# Patient Record
Sex: Female | Born: 1975 | Race: Black or African American | Hispanic: No | Marital: Single | Smoking: Former smoker
Health system: Southern US, Community
[De-identification: ages and names within clinical notes are randomized; demographics above are authoritative.]

## PROBLEM LIST (undated history)

## (undated) DIAGNOSIS — D649 Anemia, unspecified: Secondary | ICD-10-CM

## (undated) DIAGNOSIS — M1990 Unspecified osteoarthritis, unspecified site: Secondary | ICD-10-CM

---

## 2013-05-10 ENCOUNTER — Emergency Department (HOSPITAL_COMMUNITY)
Admission: EM | Admit: 2013-05-10 | Discharge: 2013-05-10 | Disposition: A | Discharge status: Home / Self Care | Payer: Medicaid Other | Attending: Emergency Medicine | Admitting: Emergency Medicine

## 2013-05-10 ENCOUNTER — Encounter (HOSPITAL_COMMUNITY): Payer: Self-pay | Admitting: Emergency Medicine

## 2013-05-10 DIAGNOSIS — Z3202 Encounter for pregnancy test, result negative: Secondary | ICD-10-CM | POA: Insufficient documentation

## 2013-05-10 DIAGNOSIS — R109 Unspecified abdominal pain: Secondary | ICD-10-CM | POA: Insufficient documentation

## 2013-05-10 DIAGNOSIS — Z862 Personal history of diseases of the blood and blood-forming organs and certain disorders involving the immune mechanism: Secondary | ICD-10-CM | POA: Insufficient documentation

## 2013-05-10 DIAGNOSIS — R112 Nausea with vomiting, unspecified: Secondary | ICD-10-CM

## 2013-05-10 DIAGNOSIS — F17200 Nicotine dependence, unspecified, uncomplicated: Secondary | ICD-10-CM | POA: Insufficient documentation

## 2013-05-10 HISTORY — DX: Anemia, unspecified: D64.9

## 2013-05-10 LAB — POCT I-STAT, CHEM 8
BUN: 4 mg/dL — ABNORMAL LOW (ref 6–23)
CREATININE: 0.8 mg/dL (ref 0.50–1.10)
Calcium, Ion: 1.16 mmol/L (ref 1.12–1.23)
Chloride: 102 mEq/L (ref 96–112)
Glucose, Bld: 96 mg/dL (ref 70–99)
HCT: 39 % (ref 36.0–46.0)
HEMOGLOBIN: 13.3 g/dL (ref 12.0–15.0)
Potassium: 3.5 mEq/L — ABNORMAL LOW (ref 3.7–5.3)
SODIUM: 142 meq/L (ref 137–147)
TCO2: 26 mmol/L (ref 0–100)

## 2013-05-10 LAB — URINALYSIS, ROUTINE W REFLEX MICROSCOPIC
Bilirubin Urine: NEGATIVE
Glucose, UA: NEGATIVE mg/dL
Ketones, ur: NEGATIVE mg/dL
LEUKOCYTES UA: NEGATIVE
Nitrite: NEGATIVE
Protein, ur: NEGATIVE mg/dL
SPECIFIC GRAVITY, URINE: 1.021 (ref 1.005–1.030)
UROBILINOGEN UA: 0.2 mg/dL (ref 0.0–1.0)
pH: 8 (ref 5.0–8.0)

## 2013-05-10 LAB — URINE MICROSCOPIC-ADD ON

## 2013-05-10 LAB — POCT PREGNANCY, URINE: Preg Test, Ur: NEGATIVE

## 2013-05-10 MED ORDER — ONDANSETRON 4 MG PO TBDP: 4.0000 mg | ORAL_TABLET | Freq: Three times a day (TID) | ORAL | Status: DC | PRN

## 2013-05-10 MED ORDER — ONDANSETRON 4 MG PO TBDP
8.0000 mg | ORAL_TABLET | Freq: Once | ORAL | Status: AC
  Administered 2013-05-10: 8 mg via ORAL
  Filled 2013-05-10: qty 2

## 2013-05-10 MED ORDER — ACETAMINOPHEN 325 MG PO TABS
650.0000 mg | ORAL_TABLET | Freq: Once | ORAL | Status: AC
  Administered 2013-05-10: 650 mg via ORAL
  Filled 2013-05-10: qty 2

## 2013-05-10 NOTE — Discharge Instructions (Signed)
Take the prescribed medication as directed for nausea. Drink plenty of fluids to keep yourself hydrated.  May wish to start with bland diet and progress as tolerated. Return to the ED for new or worsening symptoms.

## 2013-05-10 NOTE — ED Notes (Addendum)
C/o nv, onset 2330, last emesis ~20 minutes ago, also abd pain, (denies: diarrhea, fever or other sx), rates pain 10/10. Last ate 2000. Last BM today (normal). No meds PTA. Husband with pt. Alert, NAD, calm, interactive.

## 2013-05-10 NOTE — ED Provider Notes (Signed)
Medical screening examination/treatment/procedure(s) were performed by non-physician practitioner and as supervising physician I was immediately available for consultation/collaboration.  EKG Interpretation   None        Kayly Kriegel K Mourad Cwikla-Rasch, MD 05/10/13 2306 

## 2013-05-10 NOTE — ED Notes (Signed)
Pt requesting pain medication. Pt now c/o back of the head pain.

## 2013-05-10 NOTE — ED Provider Notes (Signed)
CSN: [id number]     Arrival date & time 05/10/13  0422 History   First MD Initiated Contact with Patient 05/10/13 [phone]     Chief Complaint  Patient presents with  . Emesis  . Abdominal Pain   (Consider location/radiation/quality/duration/timing/severity/associated sxs/prior Treatment) The history is provided by the patient and medical records.   This is a 38 y.o. F with PMH significant for anemia  Presenting to the ED for nausea and vomiting for the past 6 hours.  States she has some intermittent abdominal cramping which seems to move around her abdomen.  Pt denies any recent sick contacts with similar sx.  No recent changes in diet.  BM normal, no diarrhea.  No fevers or chills.  Denies urinary sx or vaginal complaints.  Prior c-section, no other abdominal surgeries.  No prior GI hx.  VS stable on arrival.  Past Medical History  Diagnosis Date  . Anemia    Past Surgical History  Procedure Laterality Date  . Cesarean section     No family history on file. History  Substance Use Topics  . Smoking status: Current Every Day Smoker  . Smokeless tobacco: Not on file  . Alcohol Use: No   OB History   Grav Para Term Preterm Abortions TAB SAB Ect Mult Living                 Review of Systems  Gastrointestinal: Positive for nausea and vomiting.  All other systems reviewed and are negative.    Allergies  Review of patient's allergies indicates no known allergies.  Home Medications  No current outpatient prescriptions on file. BP 141/58  Pulse 67  Temp(Src) 98.2 F (36.8 C) (Oral)  Resp 18  SpO2 97%  LMP 04/07/2013  Physical Exam  Nursing note and vitals reviewed. Constitutional: She is oriented to person, place, and time. She appears well-developed and well-nourished. No distress.  HENT:  Head: Normocephalic and atraumatic.  Mouth/Throat: Oropharynx is clear and moist.  Mucous membranes moist  Eyes: Conjunctivae and EOM are normal. Pupils are equal, round, and reactive  to light.  Neck: Normal range of motion. Neck supple.  Cardiovascular: Normal rate, regular rhythm and normal heart sounds.   Pulmonary/Chest: Effort normal and breath sounds normal. No respiratory distress. She has no wheezes.  Abdominal: Soft. Bowel sounds are normal. There is no tenderness. There is no guarding.  Abdomen soft, non-distended, no peritoneal signs  Musculoskeletal: Normal range of motion.  Neurological: She is alert and oriented to person, place, and time.  Skin: Skin is warm and dry. She is not diaphoretic.  Psychiatric: She has a normal mood and affect.    ED Course  Procedures (including critical care time) Labs Review Labs Reviewed  URINALYSIS, ROUTINE W REFLEX MICROSCOPIC - Abnormal; Notable for the following:    APPearance TURBID (*)    Hgb urine dipstick TRACE (*)    All other components within normal limits  URINE MICROSCOPIC-ADD ON - Abnormal; Notable for the following:    Squamous Epithelial / LPF FEW (*)    Bacteria, UA FEW (*)    All other components within normal limits  POCT I-STAT, CHEM 8 - Abnormal; Notable for the following:    Potassium 3.5 (*)    BUN 4 (*)    All other components within normal limits  POCT PREGNANCY, URINE   Imaging Review No results found.  EKG Interpretation   None       MDM   1. Nausea &  vomiting    On initial evaluation, pt sleeping in bed comfortably in NAD.  Once awoken, she states she feels better after zofran and fluids and cramping has resolved.  Abdominal exam is benign.  Will initiate PO challenge and likely discharge.  7:22 AM Pt has tolerated PO without recurrent sx or active vomiting.  Repeat abdominal exam remains benign-- low suspicion for acute or surgical abdomen at this time including, but not limited to, appendicitis, SBO, cholecystitis, pancreatitis, etc.. Sx likely due to viral gastroenteritis. Pt will be discharged with zofran.  Advised to drink plenty of fluids, start with bland diet and  progress as tolerated.  Discussed plan with pt and husband, she acknowledged understanding and agreed with plan of care.  Return precautions advised for new or worsening sx.  Garlon HatchetLisa M Sanders, PA-C 05/10/13 [phone]

## 2013-05-10 NOTE — ED Notes (Signed)
Pt requesting pain medication.  

## 2013-07-23 ENCOUNTER — Ambulatory Visit: Payer: Self-pay | Admitting: Advanced Practice Midwife

## 2013-08-20 ENCOUNTER — Emergency Department (HOSPITAL_COMMUNITY)
Admission: EM | Admit: 2013-08-20 | Discharge: 2013-08-20 | Disposition: A | Discharge status: Home / Self Care | Payer: Medicaid Other | Attending: Emergency Medicine | Admitting: Emergency Medicine

## 2013-08-20 ENCOUNTER — Encounter (HOSPITAL_COMMUNITY): Payer: Self-pay | Admitting: Emergency Medicine

## 2013-08-20 ENCOUNTER — Emergency Department (HOSPITAL_COMMUNITY): Payer: Medicaid Other

## 2013-08-20 DIAGNOSIS — F17200 Nicotine dependence, unspecified, uncomplicated: Secondary | ICD-10-CM | POA: Insufficient documentation

## 2013-08-20 DIAGNOSIS — N83209 Unspecified ovarian cyst, unspecified side: Secondary | ICD-10-CM | POA: Insufficient documentation

## 2013-08-20 DIAGNOSIS — Z862 Personal history of diseases of the blood and blood-forming organs and certain disorders involving the immune mechanism: Secondary | ICD-10-CM | POA: Insufficient documentation

## 2013-08-20 DIAGNOSIS — Z3202 Encounter for pregnancy test, result negative: Secondary | ICD-10-CM | POA: Insufficient documentation

## 2013-08-20 LAB — COMPREHENSIVE METABOLIC PANEL
ALBUMIN: 3.6 g/dL (ref 3.5–5.2)
ALT: 9 U/L (ref 0–35)
AST: 15 U/L (ref 0–37)
Alkaline Phosphatase: 65 U/L (ref 39–117)
BUN: 11 mg/dL (ref 6–23)
CALCIUM: 8.8 mg/dL (ref 8.4–10.5)
CO2: 26 mEq/L (ref 19–32)
CREATININE: 0.9 mg/dL (ref 0.50–1.10)
Chloride: 106 mEq/L (ref 96–112)
GFR calc Af Amer: >90 mL/min (ref >90)
GFR calc non Af Amer: 81 mL/min — ABNORMAL LOW (ref >90)
Glucose, Bld: 97 mg/dL (ref 70–99)
Potassium: 3.8 mEq/L (ref 3.7–5.3)
Sodium: 140 mEq/L (ref 137–147)
TOTAL PROTEIN: 7.4 g/dL (ref 6.0–8.3)
Total Bilirubin: 0.3 mg/dL (ref 0.3–1.2)

## 2013-08-20 LAB — CBC WITH DIFFERENTIAL/PLATELET
BASOS ABS: 0 10*3/uL (ref 0.0–0.1)
BASOS PCT: 0 % (ref 0–1)
EOS ABS: 0.1 10*3/uL (ref 0.0–0.7)
EOS PCT: 1 % (ref 0–5)
HEMATOCRIT: 34.2 % — AB (ref 36.0–46.0)
Hemoglobin: 11.3 g/dL — ABNORMAL LOW (ref 12.0–15.0)
Lymphocytes Relative: 23 % (ref 12–46)
Lymphs Abs: 2.4 10*3/uL (ref 0.7–4.0)
MCH: 28.8 pg (ref 26.0–34.0)
MCHC: 33 g/dL (ref 30.0–36.0)
MCV: 87 fL (ref 78.0–100.0)
MONO ABS: 0.7 10*3/uL (ref 0.1–1.0)
MONOS PCT: 7 % (ref 3–12)
Neutro Abs: 7 10*3/uL (ref 1.7–7.7)
Neutrophils Relative %: 69 % (ref 43–77)
Platelets: 280 10*3/uL (ref 150–400)
RBC: 3.93 MIL/uL (ref 3.87–5.11)
RDW: 15.5 % (ref 11.5–15.5)
WBC: 10.2 10*3/uL (ref 4.0–10.5)

## 2013-08-20 LAB — URINALYSIS, ROUTINE W REFLEX MICROSCOPIC
Bilirubin Urine: NEGATIVE
GLUCOSE, UA: NEGATIVE mg/dL
Hgb urine dipstick: NEGATIVE
KETONES UR: 15 mg/dL — AB
NITRITE: NEGATIVE
PH: 5.5 (ref 5.0–8.0)
Protein, ur: NEGATIVE mg/dL
Specific Gravity, Urine: 1.029 (ref 1.005–1.030)
Urobilinogen, UA: 0.2 mg/dL (ref 0.0–1.0)

## 2013-08-20 LAB — URINE MICROSCOPIC-ADD ON

## 2013-08-20 LAB — POC URINE PREG, ED: PREG TEST UR: NEGATIVE

## 2013-08-20 MED ORDER — SODIUM CHLORIDE 0.9 % IV BOLUS (SEPSIS)
500.0000 mL | Freq: Once | INTRAVENOUS | Status: AC
  Administered 2013-08-20: 500 mL via INTRAVENOUS

## 2013-08-20 MED ORDER — IOHEXOL 300 MG/ML  SOLN
25.0000 mL | INTRAMUSCULAR | Status: AC
  Administered 2013-08-20: 25 mL via ORAL

## 2013-08-20 MED ORDER — IOHEXOL 300 MG/ML  SOLN
100.0000 mL | Freq: Once | INTRAMUSCULAR | Status: AC | PRN
  Administered 2013-08-20: 100 mL via INTRAVENOUS

## 2013-08-20 MED ORDER — FENTANYL CITRATE 0.05 MG/ML IJ SOLN: 50.0000 ug | INTRAMUSCULAR | Status: DC | PRN

## 2013-08-20 MED ORDER — KETOROLAC TROMETHAMINE 15 MG/ML IJ SOLN
15.0000 mg | Freq: Once | INTRAMUSCULAR | Status: AC
  Administered 2013-08-20: 15 mg via INTRAVENOUS
  Filled 2013-08-20: qty 1

## 2013-08-20 MED ORDER — IBUPROFEN 800 MG PO TABS: 800.0000 mg | ORAL_TABLET | Freq: Three times a day (TID) | ORAL | Status: DC

## 2013-08-20 MED ORDER — HYDROCODONE-ACETAMINOPHEN 5-325 MG PO TABS: 2.0000 | ORAL_TABLET | ORAL | Status: DC | PRN

## 2013-08-20 NOTE — ED Notes (Signed)
Pt requesting to speak with physician regarding her prescriptions.

## 2013-08-20 NOTE — ED Notes (Signed)
Pt given CT water to drink.

## 2013-08-20 NOTE — ED Notes (Addendum)
Pt states right sided flank pain. Pt states that the pain radiates from her back to her abdomen and is only right side. Pt denies vaginal bleeding, or vaginal discharge. Pt undressed for EDP exam.

## 2013-08-20 NOTE — ED Notes (Signed)
Pt given water to drink. 

## 2013-08-20 NOTE — ED Notes (Signed)
CT aware pt finished contrast 

## 2013-08-20 NOTE — ED Provider Notes (Signed)
Patient signed out to me to follow up on ultrasound. Patient has large cystic structure seen on CT, ultrasound ordered. No evidence of torsion, hemorrhage or other competent features. Recheck the patient reveals that she is still comfortable. Will discharge with analgesia, followup with OB/GYN.  Gilda Crease, MD 08/20/13 1104

## 2013-08-20 NOTE — Discharge Instructions (Signed)
Ovarian Cyst °An ovarian cyst is a sac filled with fluid or blood. This sac is attached to the ovary. Some cysts go away on their own. Other cysts need treatment.  °HOME CARE  °· Only take medicine as told by your doctor. °· Follow up with your doctor as told. °· Get regular pelvic exams and Pap tests. °GET HELP IF: °· Your periods are late, not regular, or painful. °· You stop having periods. °· Your belly (abdominal) or pelvic pain does not go away. °· Your belly becomes large or puffy (swollen). °· You have a hard time peeing (totally emptying your bladder). °· You have pressure on your bladder. °· You have pain during sex. °· You feel fullness, pressure, or discomfort in your belly. °· You lose weight for no reason. °· You feel sick most of the time. °· You have a hard time pooping (constipation). °· You do not feel like eating. °· You develop pimples (acne). °· You have an increase in hair on your body and face. °· You are gaining weight for no reason. °· You think you are pregnant. °GET HELP RIGHT AWAY IF:  °· Your belly pain gets worse. °· You feel sick to your stomach (nauseous), and you throw up (vomit). °· You have a fever that comes on fast. °· You have belly pain while pooping (bowel movement). °· Your periods are heavier than usual. °MAKE SURE YOU:  °· Understand these instructions. °· Will watch your condition. °· Will get help right away if you are not doing well or get worse. °Document Released: 09/04/2007 Document Revised: 01/06/2013 Document Reviewed: 11/23/2012 °[employer]® Patient Information ©2014 [employer]. ° °

## 2013-08-20 NOTE — ED Provider Notes (Signed)
CSN: [id number]     Arrival date & time 08/20/13  0019 History   First MD Initiated Contact with Patient 08/20/13 0400     Chief Complaint  Patient presents with  . Abdominal Pain  . Back Pain     (Consider location/radiation/quality/duration/timing/severity/associated sxs/prior Treatment) HPI Comments: 38 year old female with smoking history presents with right lower corner and abdominal pain the past 2 days gradually worsening. Pain is fairly constant ache and radiates to the back. No history of kidney stones. No vaginal symptoms or history of ovarian cysts or pathology known. Patient never had this before. No urinary symptoms. Mild nausea without vomiting, mild soft stools.  Patient is a 38 y.o. female presenting with abdominal pain and back pain. The history is provided by the patient.  Abdominal Pain Associated symptoms: nausea   Associated symptoms: no chest pain, no chills, no dysuria, no fever, no shortness of breath and no vomiting   Back Pain Associated symptoms: abdominal pain   Associated symptoms: no chest pain, no dysuria, no fever and no headaches     Past Medical History  Diagnosis Date  . Anemia    Past Surgical History  Procedure Laterality Date  . Cesarean section     History reviewed. No pertinent family history. History  Substance Use Topics  . Smoking status: Current Every Day Smoker  . Smokeless tobacco: Not on file  . Alcohol Use: No   OB History   Grav Para Term Preterm Abortions TAB SAB Ect Mult Living                 Review of Systems  Constitutional: Negative for fever and chills.  HENT: Negative for congestion.   Eyes: Negative for visual disturbance.  Respiratory: Negative for shortness of breath.   Cardiovascular: Negative for chest pain.  Gastrointestinal: Positive for nausea and abdominal pain. Negative for vomiting.  Genitourinary: Positive for flank pain. Negative for dysuria.  Musculoskeletal: Positive for back pain. Negative for  neck pain and neck stiffness.  Skin: Negative for rash.  Neurological: Negative for light-headedness and headaches.      Allergies  Review of patient's allergies indicates no known allergies.  Home Medications   Prior to Admission medications   Medication Sig Start Date End Date Taking? Authorizing Provider  Ibuprofen-Diphenhydramine Cit (ADVIL PM PO) Take 0.5 tablets by mouth at bedtime as needed (pain).   Yes Historical Provider, MD   BP 155/69  Pulse 66  Temp(Src) 98.4 F (36.9 C) (Oral)  Resp 16  Ht 5\' 2"  (1.575 m)  Wt 238 lb (107.956 kg)  BMI 43.52 kg/m2  SpO2 100%  LMP 07/19/2013 Physical Exam  Nursing note and vitals reviewed. Constitutional: She is oriented to person, place, and time. She appears well-developed and well-nourished.  HENT:  Head: Normocephalic and atraumatic.  Eyes: Conjunctivae are normal. Right eye exhibits no discharge. Left eye exhibits no discharge.  Neck: Normal range of motion. Neck supple. No tracheal deviation present.  Cardiovascular: Normal rate and regular rhythm.   Pulmonary/Chest: Effort normal and breath sounds normal.  Abdominal: Soft. She exhibits no distension. There is tenderness (mild right lower quadrant and and right pelvic ). There is no guarding.  Musculoskeletal: She exhibits no edema.  Neurological: She is alert and oriented to person, place, and time.  Skin: Skin is warm. No rash noted.  Psychiatric: She has a normal mood and affect.    ED Course  Procedures (including critical care time) Labs Review Labs Reviewed  CBC  WITH DIFFERENTIAL - Abnormal; Notable for the following:    Hemoglobin 11.3 (*)    HCT 34.2 (*)    All other components within normal limits  COMPREHENSIVE METABOLIC PANEL - Abnormal; Notable for the following:    GFR calc non Af Amer 81 (*)    All other components within normal limits  URINALYSIS, ROUTINE W REFLEX MICROSCOPIC - Abnormal; Notable for the following:    Color, Urine AMBER (*)     APPearance HAZY (*)    Ketones, ur 15 (*)    Leukocytes, UA SMALL (*)    All other components within normal limits  URINE MICROSCOPIC-ADD ON - Abnormal; Notable for the following:    Squamous Epithelial / LPF MANY (*)    Bacteria, UA FEW (*)    All other components within normal limits  POC URINE PREG, ED    Imaging Review Ct Abdomen Pelvis W Contrast  08/20/2013   CLINICAL DATA:  Right flank pain radiating to the abdomen.  EXAM: CT ABDOMEN AND PELVIS WITH CONTRAST  TECHNIQUE: Multidetector CT imaging of the abdomen and pelvis was performed using the standard protocol following bolus administration of intravenous contrast.  CONTRAST:  OMNIPAQUE IOHEXOL 300 MG/ML  SOLN  COMPARISON:  None.  FINDINGS: The liver, spleen, pancreas, and adrenal glands appear unremarkable. Gallbladder mildly contracted but otherwise unremarkable. 5 mm hypodense lesion of the right mid kidney, technically too small to characterize.  Appendix normal.  No dilated bowel observed.  The right ovary is relatively high in position, almost is high as the iliac crests, and associated with a 6.3 x 5.4 by 6.9 cm cystic lesion with internal density 2 Hounsfield units, and without definite mural enhancement. Left ovary in uterus unremarkable. There is a small amount of complex fluid in the cul-de-sac, internal density thirty-four Hounsfield units. The  No pathologic upper abdominal adenopathy is observed. Uterus appears mildly enlarged, measuring 10.8 cm along the endometrial axis.  There is intervertebral spurring, loss of disc height, and endplate sclerosis of the L5-S1 level. The intervertebral spurring and facet arthropathy cause bilateral bony foraminal stenosis, likely severe on the right side.  There are vascular calcifications in the pelvis. Some of these are near the right distal ureter, but there is no hydroureter or hydronephrosis to suggest a distal ureteral calculus.  IMPRESSION: 1. The dominant finding is a cystic lesion  associated with the right ovary measuring up to 6.9 cm in size, along with some complex fluid in the cul-de-sac. The right ovary is relatively high in position but should be visible transabdominally in the upper pelvis, and possibly transvaginally. Pelvic sonography with Doppler assessment is recommended in order to further characterize this lesion and exclude ovarian torsion. The complex fluid in the cul-de-sac could represent a small amount of hemorrhage. 2. Mildly enlarged uterus, without readily visible focal fibroids. 3. 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 4. 5 mm hypodense lesion in the right mid kidney, statistically highly likely to represent a cyst or similar benign lesion, but technically too small to characterize.   Electronically Signed   By: Herbie Baltimore M.D.   On: 08/20/2013 08:19     EKG Interpretation None      MDM   Final diagnoses:  None   Patient with right lower abdominal pain with differential including ovarian, appendix, hernia, kidney stone, colitis, other. Blood work and urine unremarkable and vitals normal. Decision to obtain CT scan with  With differential.  Patient has  no vaginal symptoms or history of ovarian problems. Patient felt pain was more severe in the right lower abdomen. Pain meds, IV fluids and CT abdomen pending.  Patient improved on recheck. CT scans shows enlarged right ovary and recommended ultrasound to rule out torsion. Ultrasound pending updated patient signed out to ED physician for final distal.   Filed Vitals:   08/20/13 0041 08/20/13 0515 08/20/13 0700  BP: 123/80 155/69 150/95  Pulse: 68 66 67  Temp: 98.4 F (36.9 C)    TempSrc: Oral    Resp: 16 16   Height: 5\' 2"  (1.575 m)    Weight: 238 lb (107.956 kg)    SpO2: 100% 100% 100%         Enid SkeensJoshua M Icie Kuznicki, MD 08/20/13 [phone]

## 2013-08-20 NOTE — ED Notes (Signed)
Pt reports pelvic pain radiating to back. Pt denies dysuria, discharge and bleeding. Pt reports some diarrhea and nausea

## 2013-08-20 NOTE — Progress Notes (Signed)
  CARE MANAGEMENT ED NOTE 08/20/2013  Patient:  [name] [name]   Account Number:  [id number]  Date Initiated:  08/20/2013  Documentation initiated by:  Ferdinand Cava  Subjective/Objective Assessment:   38 yo female presenting to the ED with abdominal discomfort     Subjective/Objective Assessment Detail:   HPI Comments: 38 year old female with smoking history presents with right lower corner and abdominal pain the past 2 days gradually worsening. Pain is fairly constant ache and radiates to the back. No history of kidney stones. No vaginal symptoms or history of ovarian cysts or pathology known. Patient never had this before. No urinary symptoms. Mild nausea without vomiting, mild soft stools.     Action/Plan:   Action/Plan Detail:   Anticipated DC Date:       Status Recommendation to Physician:   Result of Recommendation:  Agreed    DC Planning Services  CM consult  PCP issues  Other    Choice offered to / List presented to:  C-1 Patient          Status of service:  Completed, signed off  ED Comments:   ED Comments Detail:  CM spoke with patient regarding ED visit and no documented PCP. Patient did confirm that she is a Medicaid recipient and has been receiving Medicaid since she moved to [facility] in December. She stated that she does have a provider listed on her Medicaid card but she has not established care there yet because they require her to go to the office and fill out paperwork prior to setting an initial appointment. Patient stated the listed provider office is on [address] and she does not have transportation. Provided patient with a list of PCP's that are Medicaid providers and explained that she can change her PCP but she has to call the selected office and make sure that they are accepting new Medicaid patients prior to requesting an appointment and then the will have to contact DSS to have the PCP on her card changed. Also provided patient with contact number for the  [facility]. Patient did not have any other questions or concerns. No further Case Management needs.

## 2013-08-20 NOTE — ED Notes (Signed)
Hooked pt back up to phillips monitor after returning from ultrasound. Pt being monitored on blood pressure and pulse ox.

## 2013-11-04 ENCOUNTER — Inpatient Hospital Stay (HOSPITAL_COMMUNITY)
Admission: AD | Admit: 2013-11-04 | Discharge: 2013-11-05 | Disposition: A | Discharge status: Home / Self Care | Payer: Medicaid Other | Source: Ambulatory Visit | Attending: Obstetrics & Gynecology | Admitting: Obstetrics & Gynecology

## 2013-11-04 DIAGNOSIS — N920 Excessive and frequent menstruation with regular cycle: Secondary | ICD-10-CM | POA: Diagnosis not present

## 2013-11-04 DIAGNOSIS — N938 Other specified abnormal uterine and vaginal bleeding: Secondary | ICD-10-CM | POA: Diagnosis present

## 2013-11-04 DIAGNOSIS — N83209 Unspecified ovarian cyst, unspecified side: Secondary | ICD-10-CM | POA: Insufficient documentation

## 2013-11-04 DIAGNOSIS — N926 Irregular menstruation, unspecified: Secondary | ICD-10-CM | POA: Diagnosis not present

## 2013-11-04 DIAGNOSIS — F17200 Nicotine dependence, unspecified, uncomplicated: Secondary | ICD-10-CM | POA: Insufficient documentation

## 2013-11-04 DIAGNOSIS — N949 Unspecified condition associated with female genital organs and menstrual cycle: Secondary | ICD-10-CM | POA: Diagnosis present

## 2013-11-04 DIAGNOSIS — N921 Excessive and frequent menstruation with irregular cycle: Secondary | ICD-10-CM

## 2013-11-04 LAB — URINALYSIS, ROUTINE W REFLEX MICROSCOPIC
Bilirubin Urine: NEGATIVE
GLUCOSE, UA: NEGATIVE mg/dL
Ketones, ur: 15 mg/dL — AB
Leukocytes, UA: NEGATIVE
Nitrite: NEGATIVE
PROTEIN: 100 mg/dL — AB
SPECIFIC GRAVITY, URINE: 1.025 (ref 1.005–1.030)
UROBILINOGEN UA: 1 mg/dL (ref 0.0–1.0)
pH: 5.5 (ref 5.0–8.0)

## 2013-11-04 LAB — HCG, QUANTITATIVE, PREGNANCY: hCG, Beta Chain, Quant, S: <1 m[IU]/mL (ref <5)

## 2013-11-04 LAB — URINE MICROSCOPIC-ADD ON

## 2013-11-04 LAB — CBC
HEMATOCRIT: 35.3 % — AB (ref 36.0–46.0)
Hemoglobin: 11.4 g/dL — ABNORMAL LOW (ref 12.0–15.0)
MCH: 28 pg (ref 26.0–34.0)
MCHC: 32.3 g/dL (ref 30.0–36.0)
MCV: 86.7 fL (ref 78.0–100.0)
Platelets: 250 10*3/uL (ref 150–400)
RBC: 4.07 MIL/uL (ref 3.87–5.11)
RDW: 16.2 % — ABNORMAL HIGH (ref 11.5–15.5)
WBC: 11.6 10*3/uL — AB (ref 4.0–10.5)

## 2013-11-04 LAB — POCT PREGNANCY, URINE: Preg Test, Ur: NEGATIVE

## 2013-11-04 NOTE — MAU Note (Signed)
Heavy vaginal bleeding x 5 hours. RLQ pain started after bleeding. LMP 09/19/13; normally has regular periods. Has not taken HPT, no birth control.

## 2013-11-05 DIAGNOSIS — N920 Excessive and frequent menstruation with regular cycle: Secondary | ICD-10-CM

## 2013-11-05 LAB — WET PREP, GENITAL
Trich, Wet Prep: NONE SEEN
Yeast Wet Prep HPF POC: NONE SEEN

## 2013-11-05 LAB — GC/CHLAMYDIA PROBE AMP
CT Probe RNA: NEGATIVE
GC PROBE AMP APTIMA: NEGATIVE

## 2013-11-05 MED ORDER — IBUPROFEN 800 MG PO TABS
800.0000 mg | ORAL_TABLET | Freq: Once | ORAL | Status: AC
  Administered 2013-11-05: 800 mg via ORAL
  Filled 2013-11-05: qty 1

## 2013-11-05 NOTE — MAU Provider Note (Signed)
History     CSN: [id number]  Arrival date and time: 11/04/13 2124   First Provider Initiated Contact with Patient 11/05/13 0143      Chief Complaint  Patient presents with  . Vaginal Bleeding  . Abdominal Pain   HPI  [name] [name] is a 38 y.o. who presents today with heavy vaginal bleeding. She states that she had a normal period on 08/31/13, and then did not have a cycle in July. She started bleeding on 11/04/13 and states that the bleeding was heavy. She states that she has always had very regular menstrual cycles, and has not had months that she has skipped in the past. She states that she was filling up a pad in about an hour for a couple of hours, but she it has not been that heavy for the past few hours. She had an US on 5/22 and was found to have a small cyst on the right ovary.   Past Medical History  Diagnosis Date  . Anemia     Past Surgical History  Procedure Laterality Date  . Cesarean section      No family history on file.  History  Substance Use Topics  . Smoking status: Current Every Day Smoker  . Smokeless tobacco: Not on file  . Alcohol Use: No    Allergies: No Known Allergies  Prescriptions prior to admission  Medication Sig Dispense Refill  . HYDROcodone-acetaminophen (NORCO/VICODIN) 5-325 MG per tablet Take 2 tablets by mouth every 4 (four) hours as needed for moderate pain.  20 tablet  0  . ibuprofen (ADVIL,MOTRIN) 800 MG tablet Take 1 tablet (800 mg total) by mouth 3 (three) times daily.  21 tablet  0  . Ibuprofen-Diphenhydramine Cit (ADVIL PM PO) Take 0.5 tablets by mouth at bedtime as needed (pain).        ROS Physical Exam   Blood pressure 147/76, pulse 72, resp. rate 16, last menstrual period 09/19/2013, SpO2 100.00%.  Physical Exam  Nursing note and vitals reviewed. Constitutional: She is oriented to person, place, and time. She appears well-developed and well-nourished. No distress.  Cardiovascular: Normal rate.   Respiratory: Effort  normal.  GI: Soft. There is no tenderness.  Genitourinary:   External: no lesion Vagina: small amount of blood seen  Cervix: pink, smooth, no CMT Uterus: NSSC Adnexa: NT   Neurological: She is alert and oriented to person, place, and time.  Skin: Skin is warm and dry.  Psychiatric: She has a normal mood and affect.    MAU Course  Procedures  Results for orders placed during the hospital encounter of 11/04/13 (from the past 24 hour(s))  CBC     Status: Abnormal   Collection Time    11/04/13 10:25 PM      Result Value Ref Range   WBC 11.6 (*) 4.0 - 10.5 K/uL   RBC 4.07  3.87 - 5.11 MIL/uL   Hemoglobin 11.4 (*) 12.0 - 15.0 g/dL   HCT 40.935.3 (*) 81.136.0 - 91.446.0 %   MCV 86.7  78.0 - 100.0 fL   MCH 28.0  26.0 - 34.0 pg   MCHC 32.3  30.0 - 36.0 g/dL   RDW 78.216.2 (*) 95.611.5 - 21.315.5 %   Platelets 250  150 - 400 K/uL  HCG, QUANTITATIVE, PREGNANCY     Status: None   Collection Time    11/04/13 10:25 PM      Result Value Ref Range   hCG, Beta Chain, Quant, S <1  <5  mIU/mL  URINALYSIS, ROUTINE W REFLEX MICROSCOPIC     Status: Abnormal   Collection Time    11/04/13 10:30 PM      Result Value Ref Range   Color, Urine YELLOW  YELLOW   APPearance TURBID (*) CLEAR   Specific Gravity, Urine 1.025  1.005 - 1.030   pH 5.5  5.0 - 8.0   Glucose, UA NEGATIVE  NEGATIVE mg/dL   Hgb urine dipstick LARGE (*) NEGATIVE   Bilirubin Urine NEGATIVE  NEGATIVE   Ketones, ur 15 (*) NEGATIVE mg/dL   Protein, ur 161 (*) NEGATIVE mg/dL   Urobilinogen, UA 1.0  0.0 - 1.0 mg/dL   Nitrite NEGATIVE  NEGATIVE   Leukocytes, UA NEGATIVE  NEGATIVE  URINE MICROSCOPIC-ADD ON     Status: Abnormal   Collection Time    11/04/13 10:30 PM      Result Value Ref Range   Squamous Epithelial / LPF FEW (*) RARE   WBC, UA 3-6  <3 WBC/hpf   RBC / HPF TOO NUMEROUS TO COUNT  <3 RBC/hpf   Bacteria, UA MANY (*) RARE   Urine-Other MUCOUS PRESENT    POCT PREGNANCY, URINE     Status: None   Collection Time    11/04/13 11:03 PM       Result Value Ref Range   Preg Test, Ur NEGATIVE  NEGATIVE     Assessment and Plan   1. Menorrhagia with irregular cycle    Comfort measures Pelvic US as outpatient FU with the clinic   Follow-up Information   Follow up with [facility] (They will call you with an appointment )    Specialty:  Obstetrics and Gynecology   Contact information:   [address] [place] [address] [phone]       Tawnya Crook 11/05/2013, 1:45 AM

## 2013-11-05 NOTE — Discharge Instructions (Signed)

## 2013-11-06 LAB — URINE CULTURE

## 2013-11-11 ENCOUNTER — Encounter: Payer: Self-pay | Admitting: Obstetrics & Gynecology

## 2013-12-24 ENCOUNTER — Emergency Department (INDEPENDENT_AMBULATORY_CARE_PROVIDER_SITE_OTHER)
Admission: EM | Admit: 2013-12-24 | Discharge: 2013-12-24 | Disposition: A | Discharge status: Home / Self Care | Payer: Medicaid Other | Source: Home / Self Care | Attending: Emergency Medicine | Admitting: Emergency Medicine

## 2013-12-24 ENCOUNTER — Encounter (HOSPITAL_COMMUNITY): Payer: Self-pay | Admitting: Emergency Medicine

## 2013-12-24 DIAGNOSIS — J069 Acute upper respiratory infection, unspecified: Secondary | ICD-10-CM

## 2013-12-24 DIAGNOSIS — J209 Acute bronchitis, unspecified: Secondary | ICD-10-CM

## 2013-12-24 DIAGNOSIS — J208 Acute bronchitis due to other specified organisms: Secondary | ICD-10-CM

## 2013-12-24 MED ORDER — ALBUTEROL SULFATE (2.5 MG/3ML) 0.083% IN NEBU: 2.5000 mg | INHALATION_SOLUTION | RESPIRATORY_TRACT | Status: DC | PRN

## 2013-12-24 MED ORDER — GUAIFENESIN-CODEINE 100-10 MG/5ML PO SYRP: 5.0000 mL | ORAL_SOLUTION | Freq: Three times a day (TID) | ORAL | Status: DC | PRN

## 2013-12-24 MED ORDER — IPRATROPIUM-ALBUTEROL 18-103 MCG/ACT IN AERO: 2.0000 | INHALATION_SPRAY | RESPIRATORY_TRACT | Status: DC | PRN

## 2013-12-24 MED ORDER — IPRATROPIUM BROMIDE 0.06 % NA SOLN: 2.0000 | Freq: Four times a day (QID) | NASAL | Status: DC

## 2013-12-24 MED ORDER — PREDNISONE 20 MG PO TABS: 20.0000 mg | ORAL_TABLET | Freq: Two times a day (BID) | ORAL | Status: DC

## 2013-12-24 NOTE — ED Notes (Signed)
Pharmacy called w questions; referred to Dr Lorenz Coaster

## 2013-12-24 NOTE — ED Notes (Addendum)
Patient reports having been diagnosed with bronchitis in 2010. C/o sx including nasal drainage and chest congestion with productive cough. She believes symptoms are exacerbating her bronchitis. Has been drinking hot tea and used her son's albuterol pump with no relief.  Patient is alert and oriented and in NAD.

## 2013-12-24 NOTE — ED Provider Notes (Signed)
Chief Complaint   URI   History of Present Illness   [name] [name] is a 38 year old female who has had a two-day history of cough productive of green sputum, chest tightness, and chest pain. She has also had nasal congestion with clear to green rhinorrhea, sinus pressure, itching ears, and irritated it is. She's had low-grade temperatures no higher than 99 and a sore throat. She denies any GI symptoms. She has had no sick exposures. No history of asthma or pneumonia. She does smoke 5 black and mild cigars per day.  Review of Systems   Other than as noted above, the patient denies any of the following symptoms: Systemic:  No fevers, chills, sweats, or myalgias. Eye:  No redness or discharge. ENT:  No ear pain, headache, nasal congestion, drainage, sinus pressure, or sore throat. Neck:  No neck pain, stiffness, or swollen glands. Lungs:  No cough, sputum production, hemoptysis, wheezing, chest tightness, shortness of breath or chest pain. GI:  No abdominal pain, nausea, vomiting or diarrhea.  PMFSH   Past medical history, family history, social history, meds, and allergies were reviewed. Even though she has no history of asthma, her doctor at home in [place] has prescribed Combivent and albuterol by nebulizer, also Zyrtec and Bromfed. The patient states she has a history of bronchitis.  Physical exam   Vital signs:  BP 139/85  Pulse 65  Temp(Src) 98.4 F (36.9 C) (Oral)  Resp 18  SpO2 100%  LMP 11/30/2013 General:  Alert and oriented.  In no distress.  Skin warm and dry. Eye:  No conjunctival injection or drainage. Lids were normal. ENT:  TMs and canals were normal, without erythema or inflammation.  Nasal mucosa was clear and uncongested, without drainage.  Mucous membranes were moist.  Pharynx was clear with no exudate or drainage.  There were no oral ulcerations or lesions. Neck:  Supple, no adenopathy, tenderness or mass. Lungs:  No respiratory distress.  Lungs were clear  to auscultation, without wheezes, rales or rhonchi.  Breath sounds were clear and equal bilaterally.  Heart:  Regular rhythm, without gallops, murmers or rubs. Skin:  Clear, warm, and dry, without rash or lesions.  Assessment     The primary encounter diagnosis was Viral URI. A diagnosis of Viral bronchitis was also pertinent to this visit.  No evidence of pneumonia.  Plan    1.  Meds:  The following meds were prescribed:   New Prescriptions   ALBUTEROL (PROVENTIL) (2.5 MG/3ML) 0.083% NEBULIZER SOLUTION    Take 3 mLs (2.5 mg total) by nebulization every 4 (four) hours as needed for wheezing.   ALBUTEROL-IPRATROPIUM (COMBIVENT) 18-103 MCG/ACT INHALER    Inhale 2 puffs into the lungs every 4 (four) hours as needed for wheezing or shortness of breath.   GUAIFENESIN-CODEINE (ROBITUSSIN AC) 100-10 MG/5ML SYRUP    Take 5 mLs by mouth 3 (three) times daily as needed for cough.   IPRATROPIUM (ATROVENT) 0.06 % NASAL SPRAY    Place 2 sprays into both nostrils 4 (four) times daily.   PREDNISONE (DELTASONE) 20 MG TABLET    Take 1 tablet (20 mg total) by mouth 2 (two) times daily.    2.  Patient Education/Counseling:  The patient was given appropriate handouts, self care instructions, and instructed in symptomatic relief.  Instructed to get extra fluids and extra rest.    3.  Follow up:  The patient was told to follow up here if no better in 3 to 4 days, or  sooner if becoming worse in any way, and given some red flag symptoms such as increasing fever, difficulty breathing, chest pain, or persistent vomiting which would prompt immediate return.       Reuben Likes, MD 12/24/13 [phone]

## 2013-12-24 NOTE — Discharge Instructions (Signed)
Most upper respiratory infections are caused by viruses and do not require antibiotics.  We try to save the antibiotics for when we really need them to prevent bacteria from developing resistance to them.  Here are a few hints about things that can be done at home to help get over an upper respiratory infection quicker:  Get extra sleep and extra fluids.  Get 7 to 9 hours of sleep per night and 6 to 8 glasses of water a day.  Getting extra sleep keeps the immune system from getting run down.  Most people with an upper respiratory infection are a little dehydrated.  The extra fluids also keep the secretions liquified and easier to deal with.  Also, get extra vitamin C.  4000 mg per day is the recommended dose. For the aches, headache, and fever, acetaminophen or ibuprofen are helpful.  These can be alternated every 4 hours.  People with liver disease should avoid large amounts of acetaminophen, and people with ulcer disease, gastroesophageal reflux, gastritis, congestive heart failure, chronic kidney disease, coronary artery disease and the elderly should avoid ibuprofen. For nasal congestion try Mucinex-D, or if you're having lots of sneezing or clear nasal drainage use Zyrtec-D. People with high blood pressure can take these if their blood pressure is controlled, if not, it's best to avoid the forms with a "D" (decongestants).  You can use the plain Mucinex, Allegra, Claritin, or Zyrtec even if your blood pressure is not controlled.   A Saline nasal spray such as Ocean Spray can also help.  You can add a decongestant sprays such as Afrin, but you should not use the decongestant sprays for more than 3 or 4 days since they can be habituating.  Breathe Rite nasal strips can also offer a non-drug alternative treatment to nasal congestion, especially at night. For people with symptoms of sinusitis, sleeping with your head elevated can be helpful.  For sinus pain, moist, hot compresses to the face may provide some  relief.  Many people find that inhaling steam as in a shower or from a pot of steaming water can help. For any viral infection, zinc containing lozenges such as Cold-Eze or Zicam are helpful.  Zinc helps to fight viral infection.  Hot salt water gargles (8 oz of hot water, 1/2 tsp of table salt, and a pinch of baking soda) can give relief as well as hot beverages such as hot tea.  Sucrets extra strength lozenges will help the sore throat.  For the cough, take Delsym 2 tsp every 12 hours.  It has also been found recently that Aleve can help control a cough.  The dose is 1 to 2 tablets twice daily with food.  This can be combined with Delsym. (Note, if you are taking ibuprofen, you should not take Aleve as well--take one or the other.) A cool mist vaporizer will help keep your mucous membranes from drying out.   It's important when you have an upper respiratory infection not to pass the infection to others.  This involves being very careful about the following:  Frequent hand washing or use of hand sanitizer, especially after coughing, sneezing, blowing your nose or touching your face, nose or eyes. Do not shake hands or touch anyone and try to avoid touching surfaces that other people use such as doorknobs, shopping carts, telephones and computer keyboards. Use tissues and dispose of them properly in a garbage can or ziplock bag. Cough into your sleeve. Do not let others eat or  drink after you.  It's also important to recognize the signs of serious illness and get evaluated if they occur: Any respiratory infection that lasts more than 7 to 10 days.  Yellow nasal drainage and sputum are not reliable indicators of a bacterial infection, but if they last for more than 1 week, see your doctor. Fever and sore throat can indicate strep. Fever and cough can indicate influenza or pneumonia. Any kind of severe symptom such as difficulty breathing, intractable vomiting, or severe pain should prompt you to see  a doctor as soon as possible.   Your body's immune system is really the thing that will get rid of this infection.  Your immune system is comprised of 2 types of specialized cells called T cells and B cells.  T cells coordinate the array of cells in your body that engulf invading bacteria or viruses while B cells orchestrate the production of antibodies that neutralize infection.  Anything we do or any medications we give you, will just strengthen your immune system or help it clear up the infection quicker.  Here are a few helpful hints to improve your immune system to help overcome this illness or to prevent future infections:  A few vitamins can improve the health of your immune system.  That's why your diet should include plenty of fruits, vegetables, fish, nuts, and whole grains.  Vitamin A and bet-carotene can increase the cells that fight infections (T cells and B cells).  Vitamin A is abundant in dark greens and orange vegetables such as spinach, greens, sweet potatoes, and carrots.  Vitamin B6 contributes to the maturation of white blood cells, the cells that fight disease.  Foods with vitamin B6 include cold cereal and bananas.  Vitamin C is credited with preventing colds because it increases white blood cells and also prevents cellular damage.  Citrus fruits, peaches and green and red bell peppers are all hight in vitamin C.  Vitamin E is an anti-oxidant that encourages the production of natural killer cells which reject foreign invaders and B cells that produce antibodies.  Foods high in vitamin E include wheat germ, nuts and seeds.  Foods high in omega-3 fatty acids found in foods like salmon, tuna and mackerel boost your immune system and help cells to engulf and absorb germs.  Probiotics are good bacteria that increase your T cells.  These can be found in yogurt and are available in supplements such as Culturelle or Align.  Moderate exercise increases the strength of your immune  system and your ability to recover from illness.  I suggest 3 to 5 moderate intensity 30 minute workouts per week.    Sleep is another component of maintaining a strong immune system.  It enables your body to recuperate from the day's activities, stress and work.  My recommendation is to get between 7 and 9 hours of sleep per night.  If you smoke, try to quit completely or at least cut down.  Drink alcohol only in moderation if at all.  No more than 2 drinks daily for men or 1 for women.  Get a flu vaccine early in the fall or if you have not gotten one yet, once this illness has run its course.  If you are over 65, a smoker, or an asthmatic, get a pneumococcal vaccine.  My final recommendation is to maintain a healthy weight.  Excess weight can impair the immune system by interfering with the way the immune system deals with invading viruses or  bacteria.  How to Quit Smoking  According to the U.S. Surgeon General, about 440,000 people in the [place] alone die from complications related to tobacco use.  More deaths occur due to cigarette smoking than illegal drug use, AIDS, car accidents, alcohol-related deaths, suicide and homicide combined.  Smoking accounts for about 30% of all cancer related deaths, including more than 80% of lung cancer deaths. Smoking has also been linked as the cause of many other diseases like heart disease, bronchitis, emphysema, stroke, and complications of pneumonia as well as causing an increased risk of miscarriage, premature births, stillbirth, infant death, and low birth weight in infants.  For this reason, the U.[place]. Surgeon General recommends:  "Smoking cessation (stopping smoking) represents the single most important step that smokers can take to enhance the length and quality of their lives."  Why is it so hard to Quit?  Tobacco products contain Nicotine which is highly addictive - probably as addictive as heroin or cocaine.  Over time, your body becomes  both physically and psychologically dependent on it. Finally, attempts to quit smoking are complicated by withdrawal reactions like depression, irritability, trouble sleeping, trouble concentrating, restlessness, headaches, weight gain, and excessive fatigue as well as a lack of support.  These symptoms can last from a few days to several weeks.  Why should you Quit?  Live longer and healthier  Can improves the health of your housemates (children, spouse)  Increases your energy and breathing ability  Lowers risk of heart attack, stroke and cancer  Saves money - For example, if you smoke a pack of cigarettes a day and each pack costs about $3.00, then you will save about $1,100.00 per year, about $5,500. in 5 years and $11,000.00 in 10 years.  What you can do: 1. Talk to your health care provider - There are many smoking cessations aids available, both prescription or over-the-counter.  Check with your doctor and pharmacist before taking any of these products to see which one is best for you.  Develop a plan with your healthcare provider which may include nicotine replacement, prescription medication and/or counseling.  2. Get Started - Loraine Leriche a start date on your calendar.  Remove cigarettes and ashtrays from your home, car, and office.  Dont be around other smokers.  Stop smokingnot even a puff!  3. Support - Talk to family, friends, and co-workers about your plan to stop smoking.  Ask them not to smoke around you.  4. Coping Strategies  The four As to help during tough times:  a. Avoid - Avoid other smokers or places where smoking is commonplace.  Avoid alcoholic beverages as these may increase your desire to smoke b. Alter - Change your routine.  Drink water/juices instead of alcohol or coffee.  Take a walk or visit with someone during your coffee break.  Change your route to work. c. Alternatives - Substitute raw vegetables like carrot sticks or celery, sugarless candy or gum for the  habit of having a cigarette. d. Activities - Start an exercise program (talk to your doctor prior to beginning any exercise program). Try out a new hands on hobby to distract you from smoking and to keep your hands busy like woodworking or needlepoint.   Additional tips for specific withdrawal symptoms:   Cravings for tobacco:   Distract yourself        Deep-breathing exercises        Remember that cravings are brief    Irritability:    Take a  few slow, deep breaths       Soak in a hot bath    Insomnia:    Take a walk several hours before bedtime       Avoid caffeinated beverages after noon       Read a book       Take a warm bath       Banana or warm milk    Increased appetite:   Drink water or low-calorie drinks       Make a survival Kit: Include straws, cinnamon        sticks,        coffee stirrers, licorice, toothpicks, gum, or fresh         vegetables    Inability to concentrate:  Take a brisk walk       Lighten your schedule for a couple of days       Take more breaks   Fatigue:    Get a good nights sleep       Take a nap       Dont overdo it for 2-4 weeks    Constipation, gas,  stomach pain:    Drink plenty of fluids       Increase fiber: fruit, raw vegetables, whole grain        cereals       Talk to your doctor about diet changes    5. Dealing with Relapses - Most relapses occur within the first 2-3 months.  This is common so dont be discouraged.  Some people may take several attempts before they can quit smoking completely.  6. Reward yourself - Set-up a rewards program for every milestone, like 1st month after quitting, 3rd month after quitting, and 6th month after quitting to keep you motivated.  What your doctor can do: Perform a physical exam and order diagnostic tests like laboratory blood work and a chest X-ray.  This will help to identify health related conditions that might benefit from smoking cessation. Review your health  history to make sure there are no contraindications with specific smoking cessation aids like allergies to medications or ingredients in these medications or conflicts with your current medications. Prescribe smoking cessation aids such as: Over-the-counter aid:  Nicotine gum, Nicotine patch Prescription aids:  Nicotine spray, Nicotine inhaler, or Bupropion SR (non-nicotine). Offer or recommend individual or group counseling to support you during the initial quitting and maintenance phase of your smoking cessation program. Offer or recommend other alternative treatments like hypnosis or acupuncture.  What you can expect: Benefits from quitting smoking:  Improved Physical Appearance - Minimizes or stops premature wrinkling of skin, bad breath, stained teeth, gum disease, clothes/hair smoke odors, and yellow fingernails  Improved Daily Activities - Food tastes better, sense of smell improves, and decreases shortness of breath during ordinary activities like climbing stairs, walking, and performing light housework  Decreased Financial Cost - From both no longer purchasing cigarettes and the health care cost for medical treatment of conditions caused by smoking.  Health of Others - Decreases risk of exposing others to the effects of second hand smoke.  Sets an example for youth. Benefits of Quitting according to research from the U.S. Surgeon General:  20 minutes after:  Blood pressure lowers & Body temperature normalizes  8 hours after: Carbon monoxide levels begins to normalize   24 hours after: Heart attack risk decreases  2 weeks to 3 months after:  Blood flow improves & lung function increases   1  to 9 months after:  Coughing, sinus congestion, fatigue, shortness of breath decrease   1 year after: Risk of developing coronary heart disease is half that of a smoker  5 years after: Risk of a stroke decreases to that of a nonsmoker  10 years after: Risk of death due to lung cancer  death is halved.  Risk of oral, throat, esophagus, bladder, kidney, and pancreatic cancer decreases.  15 years after: Risk of developing coronary heart disease is half that of a nonsmoker      References:  Here are references that can provide additional information and support:  [place] (800) ACS-2345       (800) Q2878766 or (800) 517-791-7164 www.cancer.org       www.amhrt.Pharmacist, community [employer] 820-147-0367 or 5592306614  (800(276)803-2080 or 949-388-6720 www.lungusa.org       www.medicalacupuncture.[place]     Office on [place] for Disease Control and Prevention (800) 4-CANCER or (800) Y5278638  (256) 288-3755 www.cancer.gov       [employer]ch  Nicotine Anonymous      Smokefree.gov 864 316 3549) TRY-NICA 540-213-2263)   (807) 463-6061) 44U-QUIT (458)586-1952) www.nicotine-anonymous.org   www.smokefree.gov  Contact your doctor or pharmacist if you have specific questions about starting a smoking cessation program.

## 2013-12-29 ENCOUNTER — Encounter: Payer: Self-pay | Admitting: *Deleted

## 2013-12-29 ENCOUNTER — Encounter: Payer: Medicaid Other | Admitting: Obstetrics & Gynecology

## 2013-12-29 ENCOUNTER — Telehealth: Payer: Self-pay | Admitting: *Deleted

## 2013-12-29 NOTE — Telephone Encounter (Signed)
Attempted to contact patient, no answer, left message concerning missed appointment and for patient to call and reschedule.  Will send letter.  Letter sent. 

## 2014-05-06 ENCOUNTER — Emergency Department (HOSPITAL_COMMUNITY)
Admission: EM | Admit: 2014-05-06 | Discharge: 2014-05-07 | Disposition: A | Discharge status: Home / Self Care | Payer: Medicaid Other | Attending: Emergency Medicine | Admitting: Emergency Medicine

## 2014-05-06 ENCOUNTER — Encounter (HOSPITAL_COMMUNITY): Payer: Self-pay | Admitting: *Deleted

## 2014-05-06 ENCOUNTER — Emergency Department (HOSPITAL_COMMUNITY): Payer: Medicaid Other

## 2014-05-06 DIAGNOSIS — Z862 Personal history of diseases of the blood and blood-forming organs and certain disorders involving the immune mechanism: Secondary | ICD-10-CM | POA: Insufficient documentation

## 2014-05-06 DIAGNOSIS — Z79899 Other long term (current) drug therapy: Secondary | ICD-10-CM | POA: Diagnosis not present

## 2014-05-06 DIAGNOSIS — Z7952 Long term (current) use of systemic steroids: Secondary | ICD-10-CM | POA: Diagnosis not present

## 2014-05-06 DIAGNOSIS — R630 Anorexia: Secondary | ICD-10-CM | POA: Insufficient documentation

## 2014-05-06 DIAGNOSIS — Z3202 Encounter for pregnancy test, result negative: Secondary | ICD-10-CM | POA: Diagnosis not present

## 2014-05-06 DIAGNOSIS — Z720 Tobacco use: Secondary | ICD-10-CM | POA: Diagnosis not present

## 2014-05-06 DIAGNOSIS — N83209 Unspecified ovarian cyst, unspecified side: Secondary | ICD-10-CM

## 2014-05-06 DIAGNOSIS — N838 Other noninflammatory disorders of ovary, fallopian tube and broad ligament: Secondary | ICD-10-CM | POA: Diagnosis not present

## 2014-05-06 DIAGNOSIS — R1031 Right lower quadrant pain: Secondary | ICD-10-CM | POA: Diagnosis present

## 2014-05-06 DIAGNOSIS — Z791 Long term (current) use of non-steroidal anti-inflammatories (NSAID): Secondary | ICD-10-CM | POA: Diagnosis not present

## 2014-05-06 LAB — LIPASE, BLOOD: Lipase: 30 U/L (ref 11–59)

## 2014-05-06 LAB — COMPREHENSIVE METABOLIC PANEL
ALK PHOS: 68 U/L (ref 39–117)
ALT: 13 U/L (ref 0–35)
ANION GAP: 4 — AB (ref 5–15)
AST: 21 U/L (ref 0–37)
Albumin: 3.8 g/dL (ref 3.5–5.2)
BILIRUBIN TOTAL: 0.6 mg/dL (ref 0.3–1.2)
BUN: 10 mg/dL (ref 6–23)
CO2: 25 mmol/L (ref 19–32)
CREATININE: 0.89 mg/dL (ref 0.50–1.10)
Calcium: 9.1 mg/dL (ref 8.4–10.5)
Chloride: 107 mmol/L (ref 96–112)
GFR calc non Af Amer: 82 mL/min — ABNORMAL LOW (ref >90)
Glucose, Bld: 94 mg/dL (ref 70–99)
POTASSIUM: 3.7 mmol/L (ref 3.5–5.1)
Sodium: 136 mmol/L (ref 135–145)
TOTAL PROTEIN: 7.4 g/dL (ref 6.0–8.3)

## 2014-05-06 LAB — CBC WITH DIFFERENTIAL/PLATELET
BASOS ABS: 0 10*3/uL (ref 0.0–0.1)
Basophils Relative: 0 % (ref 0–1)
EOS ABS: 0 10*3/uL (ref 0.0–0.7)
Eosinophils Relative: 0 % (ref 0–5)
HCT: 34.2 % — ABNORMAL LOW (ref 36.0–46.0)
HEMOGLOBIN: 11 g/dL — AB (ref 12.0–15.0)
Lymphocytes Relative: 13 % (ref 12–46)
Lymphs Abs: 1.8 10*3/uL (ref 0.7–4.0)
MCH: 26.5 pg (ref 26.0–34.0)
MCHC: 32.2 g/dL (ref 30.0–36.0)
MCV: 82.4 fL (ref 78.0–100.0)
Monocytes Absolute: 0.9 10*3/uL (ref 0.1–1.0)
Monocytes Relative: 6 % (ref 3–12)
NEUTROS ABS: 11.3 10*3/uL — AB (ref 1.7–7.7)
NEUTROS PCT: 81 % — AB (ref 43–77)
Platelets: 353 10*3/uL (ref 150–400)
RBC: 4.15 MIL/uL (ref 3.87–5.11)
RDW: 16.7 % — ABNORMAL HIGH (ref 11.5–15.5)
WBC: 14 10*3/uL — ABNORMAL HIGH (ref 4.0–10.5)

## 2014-05-06 LAB — URINE MICROSCOPIC-ADD ON

## 2014-05-06 LAB — URINALYSIS, ROUTINE W REFLEX MICROSCOPIC
GLUCOSE, UA: NEGATIVE mg/dL
Ketones, ur: 15 mg/dL — AB
Leukocytes, UA: NEGATIVE
Nitrite: NEGATIVE
Protein, ur: 100 mg/dL — AB
Specific Gravity, Urine: 1.029 (ref 1.005–1.030)
Urobilinogen, UA: 0.2 mg/dL (ref 0.0–1.0)
pH: 5.5 (ref 5.0–8.0)

## 2014-05-06 LAB — PREGNANCY, URINE: PREG TEST UR: NEGATIVE

## 2014-05-06 MED ORDER — OXYCODONE-ACETAMINOPHEN 5-325 MG PO TABS
1.0000 | ORAL_TABLET | Freq: Once | ORAL | Status: AC
  Administered 2014-05-06: 1 via ORAL
  Filled 2014-05-06: qty 1

## 2014-05-06 MED ORDER — ONDANSETRON 4 MG PO TBDP
8.0000 mg | ORAL_TABLET | Freq: Once | ORAL | Status: AC
  Administered 2014-05-06: 8 mg via ORAL
  Filled 2014-05-06: qty 2

## 2014-05-06 NOTE — ED Notes (Signed)
The pot is c/o rt lower abd and rt flank oauin fir 3 days no urinary symotims  lmp jan 22nd

## 2014-05-06 NOTE — ED Provider Notes (Addendum)
CSN: [id number]     Arrival date & time 05/06/14  1853 History   First MD Initiated Contact with Patient 05/06/14 2122     Chief Complaint  Patient presents with  . Abdominal Pain     (Consider location/radiation/quality/duration/timing/severity/associated sxs/prior Treatment) Patient is a 39 y.o. female presenting with abdominal pain. The history is provided by the patient.  Abdominal Pain Pain location:  RLQ Pain quality: aching, sharp and stiffness   Pain radiates to:  Back Pain severity:  Moderate Onset quality:  Gradual Duration:  3 days Timing:  Constant Progression:  Worsening Chronicity:  Recurrent Context: not eating, not previous surgeries, not recent sexual activity and not trauma   Context comment:  Pt with known R ovarian cyst Relieved by:  Lying down Worsened by:  Movement Ineffective treatments:  None tried Associated symptoms: anorexia   Associated symptoms: no chest pain, no constipation, no cough, no diarrhea, no dysuria, no fever, no nausea, no shortness of breath, no vaginal bleeding and no vaginal discharge   Risk factors: has not had multiple surgeries, not pregnant and no recent hospitalization     Past Medical History  Diagnosis Date  . Anemia    Past Surgical History  Procedure Laterality Date  . Cesarean section     No family history on file. History  Substance Use Topics  . Smoking status: Current Every Day Smoker  . Smokeless tobacco: Not on file  . Alcohol Use: No   OB History    No data available     Review of Systems  Constitutional: Negative for fever.  Respiratory: Negative for cough and shortness of breath.   Cardiovascular: Negative for chest pain.  Gastrointestinal: Positive for abdominal pain and anorexia. Negative for nausea, diarrhea and constipation.  Genitourinary: Negative for dysuria, vaginal bleeding and vaginal discharge.  All other systems reviewed and are negative.     Allergies  Review of patient's allergies  indicates no known allergies.  Home Medications   Prior to Admission medications   Medication Sig Start Date End Date Taking? Authorizing Provider  albuterol (PROVENTIL) (2.5 MG/3ML) 0.083% nebulizer solution Take 3 mLs (2.5 mg total) by nebulization every 4 (four) hours as needed for wheezing. 12/24/13   Reuben Likes, MD  albuterol-ipratropium (COMBIVENT) 18-103 MCG/ACT inhaler Inhale 2 puffs into the lungs every 4 (four) hours as needed for wheezing or shortness of breath. 12/24/13   Reuben Likes, MD  guaiFENesin-codeine [facility]) 100-10 MG/5ML syrup Take 5 mLs by mouth 3 (three) times daily as needed for cough. 12/24/13   Reuben Likes, MD  HYDROcodone-acetaminophen (NORCO/VICODIN) 5-325 MG per tablet Take 2 tablets by mouth every 4 (four) hours as needed for moderate pain. 08/20/13   Gilda Crease, MD  ibuprofen (ADVIL,MOTRIN) 800 MG tablet Take 1 tablet (800 mg total) by mouth 3 (three) times daily. 08/20/13   Gilda Crease, MD  Ibuprofen-Diphenhydramine Cit (ADVIL PM PO) Take 0.5 tablets by mouth at bedtime as needed (pain).    Historical Provider, MD  ipratropium (ATROVENT) 0.06 % nasal spray Place 2 sprays into both nostrils 4 (four) times daily. 12/24/13   Reuben Likes, MD  predniSONE (DELTASONE) 20 MG tablet Take 1 tablet (20 mg total) by mouth 2 (two) times daily. 12/24/13   Reuben Likes, MD   BP 122/56 mmHg  Pulse 62  Temp(Src) 98.5 F (36.9 C) (Oral)  Resp 16  Ht  (1.549 m)  Wt 234 lb 5.6 oz (106.3  kg)  BMI 44.30 kg/m2  SpO2 99%  LMP 04/22/2014 Physical Exam  Constitutional: She is oriented to person, place, and time. She appears well-developed and well-nourished. No distress.  HENT:  Head: Normocephalic and atraumatic.  Mouth/Throat: Oropharynx is clear and moist.  Eyes: Conjunctivae and EOM are normal. Pupils are equal, round, and reactive to light.  Neck: Normal range of motion. Neck supple.  Cardiovascular: Normal rate, regular rhythm  and intact distal pulses.   No murmur heard. Pulmonary/Chest: Effort normal and breath sounds normal. No respiratory distress. She has no wheezes. She has no rales.  Abdominal: Soft. Normal appearance. She exhibits no distension. There is tenderness in the right lower quadrant. There is no rebound, no guarding, no CVA tenderness and no tenderness at McBurney's point.    minmal pain with palpation  Musculoskeletal: Normal range of motion. She exhibits no edema or tenderness.  Neurological: She is alert and oriented to person, place, and time.  Skin: Skin is warm and dry. No rash noted. No erythema.  Psychiatric: She has a normal mood and affect. Her behavior is normal.  Nursing note and vitals reviewed.   ED Course  Procedures (including critical care time) Labs Review Labs Reviewed  CBC WITH DIFFERENTIAL/PLATELET - Abnormal; Notable for the following:    WBC 14.0 (*)    Hemoglobin 11.0 (*)    HCT 34.2 (*)    RDW 16.7 (*)    Neutrophils Relative % 81 (*)    Neutro Abs 11.3 (*)    All other components within normal limits  COMPREHENSIVE METABOLIC PANEL - Abnormal; Notable for the following:    GFR calc non Af Amer 82 (*)    Anion gap 4 (*)    All other components within normal limits  URINALYSIS, ROUTINE W REFLEX MICROSCOPIC - Abnormal; Notable for the following:    Color, Urine AMBER (*)    Hgb urine dipstick SMALL (*)    Bilirubin Urine SMALL (*)    Ketones, ur 15 (*)    Protein, ur 100 (*)    All other components within normal limits  URINE MICROSCOPIC-ADD ON - Abnormal; Notable for the following:    Squamous Epithelial / LPF MANY (*)    Bacteria, UA MANY (*)    Casts HYALINE CASTS (*)    All other components within normal limits  LIPASE, BLOOD  PREGNANCY, URINE    Imaging Review US Transvaginal Non-ob  05/07/2014   CLINICAL DATA:  Right-sided pelvic pain.  EXAM: TRANSABDOMINAL AND TRANSVAGINAL ULTRASOUND OF PELVIS  TECHNIQUE: Both transabdominal and transvaginal  ultrasound examinations of the pelvis were performed. Transabdominal technique was performed for global imaging of the pelvis including uterus, ovaries, adnexal regions, and pelvic cul-de-sac. It was necessary to proceed with endovaginal exam following the transabdominal exam to visualize the ovaries and endometrium.  COMPARISON:  None  FINDINGS: Uterus  Measurements: 11.3 x 5.5 x 6.3 cm. No fibroids or other mass visualized.  Endometrium  Thickness: 11 mm.  No focal abnormality visualized.  Right ovary  Measurements: 3.2 x 2.4 x 1.8 cm. Normal appearance/no adnexal mass.  Left ovary  Not visualized.  Other findings  Small amount of free pelvic fluid.  IMPRESSION: Normal uterus and right ovary.  The left ovary is not visualized.  Small amount of free pelvic fluid.   Electronically Signed   By: Loralie Champagne M.D.   On: 05/07/2014 00:01   US Pelvis Complete  05/07/2014   CLINICAL DATA:  Right-sided pelvic pain.  EXAM: TRANSABDOMINAL AND TRANSVAGINAL ULTRASOUND OF PELVIS  TECHNIQUE: Both transabdominal and transvaginal ultrasound examinations of the pelvis were performed. Transabdominal technique was performed for global imaging of the pelvis including uterus, ovaries, adnexal regions, and pelvic cul-de-sac. It was necessary to proceed with endovaginal exam following the transabdominal exam to visualize the ovaries and endometrium.  COMPARISON:  None  FINDINGS: Uterus  Measurements: 11.3 x 5.5 x 6.3 cm. No fibroids or other mass visualized.  Endometrium  Thickness: 11 mm.  No focal abnormality visualized.  Right ovary  Measurements: 3.2 x 2.4 x 1.8 cm. Normal appearance/no adnexal mass.  Left ovary  Not visualized.  Other findings  Small amount of free pelvic fluid.  IMPRESSION: Normal uterus and right ovary.  The left ovary is not visualized.  Small amount of free pelvic fluid.   Electronically Signed   By: Loralie ChampagneMark  Gallerani M.D.   On: 05/07/2014 00:01     EKG Interpretation None      MDM   Final  diagnoses:  RLQ abdominal pain  Ruptured ovarian cyst    Patient presenting today with persistent right pelvic pain that has been ongoing for the last 3 days with a prior history of a 6 cm ovarian mass discovered approximately 10 months ago. Patient has follow-up with OB/GYN next week with a repeat pelvic ultrasound ordered however patient states because the pain was not improving she came here for further evaluation. She denies nausea, vomiting, diarrhea, fever, vaginal discharge, bleeding.  She has only minimal right pelvic tenderness on exam without peritoneal signs. Labs are significant for a leukocytosis of 14,000 but otherwise no other significant findings.  Will repeat pelvic ultrasound to evaluate the right ovarian mass  12:10 AM Today no evidence of cyst but does have some free pelvic fluid which may be a ruptured cyst.  Pt has no findings consistent with appendicitis based on exam.    12:17 AM On re-eval no significant abd pain.  Still denying pelvic d/c and no new partners.  Same partner for 10 years.  Gwyneth SproutWhitney Ziare Orrick, MD 05/07/14 [id number]  Gwyneth SproutWhitney Annalaya Wile, MD 05/07/14 [id number]

## 2014-05-07 MED ORDER — HYDROCODONE-ACETAMINOPHEN 5-325 MG PO TABS: 1.0000 | ORAL_TABLET | Freq: Four times a day (QID) | ORAL | Status: DC | PRN

## 2014-05-11 ENCOUNTER — Encounter: Payer: Medicaid Other | Admitting: Obstetrics and Gynecology

## 2014-06-08 ENCOUNTER — Encounter: Payer: Medicaid Other | Admitting: Obstetrics & Gynecology

## 2014-07-02 ENCOUNTER — Emergency Department (HOSPITAL_COMMUNITY): Payer: Medicaid Other

## 2014-07-02 ENCOUNTER — Encounter (HOSPITAL_COMMUNITY): Payer: Self-pay

## 2014-07-02 ENCOUNTER — Emergency Department (HOSPITAL_COMMUNITY)
Admission: EM | Admit: 2014-07-02 | Discharge: 2014-07-02 | Disposition: A | Discharge status: Home / Self Care | Payer: Medicaid Other | Attending: Emergency Medicine | Admitting: Emergency Medicine

## 2014-07-02 DIAGNOSIS — Z720 Tobacco use: Secondary | ICD-10-CM | POA: Insufficient documentation

## 2014-07-02 DIAGNOSIS — Z7951 Long term (current) use of inhaled steroids: Secondary | ICD-10-CM | POA: Diagnosis not present

## 2014-07-02 DIAGNOSIS — Z7952 Long term (current) use of systemic steroids: Secondary | ICD-10-CM | POA: Insufficient documentation

## 2014-07-02 DIAGNOSIS — M436 Torticollis: Secondary | ICD-10-CM | POA: Diagnosis not present

## 2014-07-02 DIAGNOSIS — R110 Nausea: Secondary | ICD-10-CM | POA: Diagnosis not present

## 2014-07-02 DIAGNOSIS — Z862 Personal history of diseases of the blood and blood-forming organs and certain disorders involving the immune mechanism: Secondary | ICD-10-CM | POA: Insufficient documentation

## 2014-07-02 DIAGNOSIS — Z791 Long term (current) use of non-steroidal anti-inflammatories (NSAID): Secondary | ICD-10-CM | POA: Diagnosis not present

## 2014-07-02 DIAGNOSIS — M542 Cervicalgia: Secondary | ICD-10-CM | POA: Diagnosis present

## 2014-07-02 LAB — I-STAT BETA HCG BLOOD, ED (MC, WL, AP ONLY): I-stat hCG, quantitative: <5 m[IU]/mL (ref <5)

## 2014-07-02 LAB — I-STAT CHEM 8, ED
BUN: 9 mg/dL (ref 6–23)
CREATININE: 0.7 mg/dL (ref 0.50–1.10)
Calcium, Ion: 1.19 mmol/L (ref 1.12–1.23)
Chloride: 103 mmol/L (ref 96–112)
Glucose, Bld: 89 mg/dL (ref 70–99)
HCT: 35 % — ABNORMAL LOW (ref 36.0–46.0)
HEMOGLOBIN: 11.9 g/dL — AB (ref 12.0–15.0)
POTASSIUM: 4 mmol/L (ref 3.5–5.1)
Sodium: 141 mmol/L (ref 135–145)
TCO2: 22 mmol/L (ref 0–100)

## 2014-07-02 LAB — RAPID STREP SCREEN (MED CTR MEBANE ONLY): STREPTOCOCCUS, GROUP A SCREEN (DIRECT): NEGATIVE

## 2014-07-02 MED ORDER — PREDNISONE 10 MG PO TABS: 20.0000 mg | ORAL_TABLET | Freq: Every day | ORAL | Status: DC

## 2014-07-02 MED ORDER — HYDROCODONE-ACETAMINOPHEN 5-325 MG PO TABS: 1.0000 | ORAL_TABLET | ORAL | Status: DC | PRN

## 2014-07-02 MED ORDER — PREDNISONE 20 MG PO TABS
60.0000 mg | ORAL_TABLET | Freq: Once | ORAL | Status: AC
  Administered 2014-07-02: 60 mg via ORAL
  Filled 2014-07-02: qty 3

## 2014-07-02 MED ORDER — DIAZEPAM 5 MG/ML IJ SOLN
5.0000 mg | Freq: Once | INTRAMUSCULAR | Status: AC
  Administered 2014-07-02: 5 mg via INTRAMUSCULAR
  Filled 2014-07-02: qty 2

## 2014-07-02 MED ORDER — CYCLOBENZAPRINE HCL 5 MG PO TABS: 5.0000 mg | ORAL_TABLET | Freq: Two times a day (BID) | ORAL | Status: DC | PRN

## 2014-07-02 MED ORDER — TRAMADOL HCL 50 MG PO TABS
50.0000 mg | ORAL_TABLET | Freq: Once | ORAL | Status: AC
Start: 2014-07-02 — End: 2014-07-02
  Administered 2014-07-02: 50 mg via ORAL
  Filled 2014-07-02: qty 1

## 2014-07-02 NOTE — ED Provider Notes (Signed)
CSN: [id number]     Arrival date & time 07/02/14  1603 History  This chart was scribed for Marlon Pel, PA-C, working with Doug Sou, MD by Chestine Spore, ED Scribe. The patient was seen in room TR07C/TR07C at 5:44 PM.    Chief Complaint  Patient presents with  . Neck Pain      The history is provided by the patient. No language interpreter was used.    HPI Comments: [name] [name] is a 39 y.o. female who presents to the Emergency Department via EMS complaining of worsening neck pain onset yesterday around 6 PM. Pt notes that she is able to turn her head to the right but turning to the left is painful. Pt had a sore throat 4 days ago prior to her neck hurting. Pt notes that her sore throat has resolved since. Pt has never had these symptoms happen before. Pt denies the pain hurting when she sits down. She states that she is having associated symptoms of nausea, myalgia, neck stiffness. She states that she has tried 2 ibuprofen with no relief for her symptoms. Pt has not had strep throat in years. She denies fever, v/d, CP, SOB, abdominal pain, dental pain, sore throat, and any other symptoms. Pt notes that her daughter was recently seen for strep throat. Pt is not allergic to any medications.    Past Medical History  Diagnosis Date  . Anemia    Past Surgical History  Procedure Laterality Date  . Cesarean section     History reviewed. No pertinent family history. History  Substance Use Topics  . Smoking status: Current Every Day Smoker -- 1.00 packs/day    Types: Cigars  . Smokeless tobacco: Not on file  . Alcohol Use: No   OB History    No data available     Review of Systems  Constitutional: Negative for fever.  HENT: Negative for dental problem and sore throat.   Respiratory: Negative for shortness of breath.   Cardiovascular: Negative for chest pain.  Gastrointestinal: Positive for nausea. Negative for vomiting, abdominal pain and diarrhea.  Musculoskeletal:  Positive for myalgias, neck pain and neck stiffness.      Allergies  Review of patient's allergies indicates no known allergies.  Home Medications   Prior to Admission medications   Medication Sig Start Date End Date Taking? Authorizing Provider  albuterol (PROVENTIL) (2.5 MG/3ML) 0.083% nebulizer solution Take 3 mLs (2.5 mg total) by nebulization every 4 (four) hours as needed for wheezing. 12/24/13   Reuben Likes, MD  albuterol-ipratropium (COMBIVENT) 18-103 MCG/ACT inhaler Inhale 2 puffs into the lungs every 4 (four) hours as needed for wheezing or shortness of breath. 12/24/13   Reuben Likes, MD  cyclobenzaprine (FLEXERIL) 5 MG tablet Take 1 tablet (5 mg total) by mouth 2 (two) times daily as needed for muscle spasms. 07/02/14   Linday Rhodes Neva Seat, PA-C  guaiFENesin-codeine (ROBITUSSIN AC) 100-10 MG/5ML syrup Take 5 mLs by mouth 3 (three) times daily as needed for cough. 12/24/13   Reuben Likes, MD  HYDROcodone-acetaminophen (NORCO/VICODIN) 5-325 MG per tablet Take 1-2 tablets by mouth every 6 (six) hours as needed for moderate pain or severe pain. 05/07/14   Gwyneth Sprout, MD  HYDROcodone-acetaminophen (NORCO/VICODIN) 5-325 MG per tablet Take 1-2 tablets by mouth every 4 (four) hours as needed. 07/02/14   Kaylean Tupou Neva Seat, PA-C  ibuprofen (ADVIL,MOTRIN) 800 MG tablet Take 1 tablet (800 mg total) by mouth 3 (three) times daily. 08/20/13   Gilda Crease,  MD  Ibuprofen-Diphenhydramine Cit (ADVIL PM PO) Take 0.5 tablets by mouth at bedtime as needed (pain).    Historical Provider, MD  ipratropium (ATROVENT) 0.06 % nasal spray Place 2 sprays into both nostrils 4 (four) times daily. 12/24/13   Reuben Likesavid C Keller, MD  predniSONE (DELTASONE) 10 MG tablet Take 2 tablets (20 mg total) by mouth daily. 07/02/14   Loie Jahr Neva SeatGreene, PA-C  predniSONE (DELTASONE) 20 MG tablet Take 1 tablet (20 mg total) by mouth 2 (two) times daily. 12/24/13   Reuben Likesavid C Keller, MD   BP 144/78 mmHg  Pulse 65  Temp(Src) 98.1 F  (36.7 C) (Oral)  Resp 16  Ht 5' (1.524 m)  Wt 236 lb (107.049 kg)  BMI 46.09 kg/m2  SpO2 100%  LMP 06/11/2014  Physical Exam  Constitutional: She is oriented to person, place, and time. She appears well-developed and well-nourished. No distress.  HENT:  Head: Normocephalic and atraumatic.  Eyes: EOM are normal.  Neck: Neck supple. Muscular tenderness present. No spinous process tenderness present. No rigidity. Decreased range of motion (due to pain) present. No tracheal deviation, no edema and no erythema present.  No erythema, obvious swelling or induration to the neck. No cervical midline tenderness. Pt has pain to the SCM of left  Neck.  Cardiovascular: Normal rate.   Pulmonary/Chest: Effort normal. No respiratory distress.  Neurological: She is alert and oriented to person, place, and time.  Skin: Skin is warm and dry.  Psychiatric: She has a normal mood and affect. Her behavior is normal.  Nursing note and vitals reviewed.   ED Course  Procedures (including critical care time) DIAGNOSTIC STUDIES: Oxygen Saturation is 100% on RA, normal by my interpretation.    COORDINATION OF CARE: 5:48 PM-Discussed treatment plan which includes rapid strep screen and culture, valium, ultram, CT without contrast of C-Spine with pt at bedside and pt agreed to plan.   Labs Review Labs Reviewed  I-STAT CHEM 8, ED - Abnormal; Notable for the following:    Hemoglobin 11.9 (*)    HCT 35.0 (*)    All other components within normal limits  RAPID STREP SCREEN  CULTURE, GROUP A STREP  I-STAT BETA HCG BLOOD, ED ([facility], [facility], [facility] ONLY)   Results for orders placed or performed during the hospital encounter of 07/02/14  Rapid strep screen  Result Value Ref Range   Streptococcus, Group A Screen (Direct) NEGATIVE NEGATIVE  I-stat chem 8, ed  Result Value Ref Range   Sodium 141 135 - 145 mmol/L   Potassium 4.0 3.5 - 5.1 mmol/L   Chloride 103 96 - 112 mmol/L   BUN 9 6 - 23 mg/dL   Creatinine, Ser  1.610.70 0.50 - 1.10 mg/dL   Glucose, Bld 89 70 - 99 mg/dL   Calcium, Ion 0.961.19 0.451.12 - 1.23 mmol/L   TCO2 22 0 - 100 mmol/L   Hemoglobin 11.9 (L) 12.0 - 15.0 g/dL   HCT 40.935.0 (L) 81.136.0 - 91.446.0 %  I-Stat Beta hCG blood, ED ([facility], [facility], [facility] only)  Result Value Ref Range   I-stat hCG, quantitative <5.0 <5 mIU/mL   Comment 3           Ct Cervical Spine Wo Contrast  07/02/2014   CLINICAL DATA:  Left neck pain for 2 days  EXAM: CT CERVICAL SPINE WITHOUT CONTRAST  TECHNIQUE: Multidetector CT imaging of the cervical spine was performed without intravenous contrast. Multiplanar CT image reconstructions were also generated.  COMPARISON:  None.  FINDINGS: C1  through the cervicothoracic junction is visualized in its entirety. There is no evidence of cervical spine fracture or prevertebral soft tissue swelling. Alignment is normal with the exception of loss of the normal cervical lordosis centered at C5 which could be positional. No other significant bone abnormalities are identified. Mild disc degenerative change is noted at C5-C6 with minimal if any mass effect upon the right neural foramen at that level. Airways patent and midline.  IMPRESSION: No acute abnormality.  Mild C5-C6 disc degenerative change.   Electronically Signed   By: Christiana Pellant M.D.   On: 07/02/2014 21:30     Imaging Review Ct Cervical Spine Wo Contrast  07/02/2014   CLINICAL DATA:  Left neck pain for 2 days  EXAM: CT CERVICAL SPINE WITHOUT CONTRAST  TECHNIQUE: Multidetector CT imaging of the cervical spine was performed without intravenous contrast. Multiplanar CT image reconstructions were also generated.  COMPARISON:  None.  FINDINGS: C1 through the cervicothoracic junction is visualized in its entirety. There is no evidence of cervical spine fracture or prevertebral soft tissue swelling. Alignment is normal with the exception of loss of the normal cervical lordosis centered at C5 which could be positional. No other significant bone abnormalities  are identified. Mild disc degenerative change is noted at C5-C6 with minimal if any mass effect upon the right neural foramen at that level. Airways patent and midline.  IMPRESSION: No acute abnormality.  Mild C5-C6 disc degenerative change.   Electronically Signed   By: Christiana Pellant M.D.   On: 07/02/2014 21:30     EKG Interpretation None      MDM   Final diagnoses:  Torticollis, acute   Medications  diazepam (VALIUM) injection 5 mg (5 mg Intramuscular Given 07/02/14 1805)  traMADol (ULTRAM) tablet 50 mg (50 mg Oral Given 07/02/14 1804)  predniSONE (DELTASONE) tablet 60 mg (60 mg Oral Given 07/02/14 1936)    Presentation is non concerning for [facility], [facility], Meningitis, or temporal arteritis. Pt is afebrile with no focal neuro deficits, nuchal rigidity, or change in vision. The patient denies any symptoms of neurological impairment or TIA's; no amaurosis, diplopia, dysphasia, or unilateral disturbance of motor or sensory function. No loss of balance or vertigo.  Patient has phsyiologic strength to bilateral upper and lower extremities. No neurological deficits, pain is primarily paraspinal. She does not have obvious source of infection ,her vitals are normal and screening labs are reassuring.  Rx Prednisone d ose pack, muscle relaxer and pain medication. Referral to Orthopedist.  39 y.o.[name] [name]  with back pain. No neurological deficits and normal neuro exam. Patient can walk. No loss of bowel or bladder control. No concern for cauda equina at this time base on HPI and physical exam findings. No fever, night sweats, weight loss, h/o cancer, IVDU.   Patient Plan 1. Medications: NSAIDs and muscle relaxer. Cont usual home medications unless otherwise directed. 2. Treatment: rest, drink plenty of fluids, gentle stretching as discussed, alternate ice and heat  3. Follow Up: Please followup with your primary doctor for discussion of your diagnoses and further evaluation after today's visit;  if you do not have a primary care doctor use the resource guide provided to find one  Advised to follow-up with the orthopedist if symptoms do not start to resolve in the next 2-3 days. If develop loss of bowel or urinary control return to the ED as soon as possible for further evaluation. To take the medications as prescribed as they can cause harm if not taken  appropriately.   Vital signs are stable at discharge. Filed Vitals:   07/02/14 2005  BP: 159/77  Pulse: 56  Temp: 97.9 F (36.6 C)  Resp: 18    Patient/guardian has voiced understanding and agreed to follow-up with the PCP or specialist.   I personally performed the services described in this documentation, which was scribed in my presence. The recorded information has been reviewed and is accurate.    Marlon Pel, PA-C 07/02/14 2144  Doug Sou, MD 07/03/14 [phone]

## 2014-07-02 NOTE — Discharge Instructions (Signed)

## 2014-07-02 NOTE — ED Notes (Addendum)
To room via EMS, Onset yesterday left posterior neck.  Able to turn head to right but painful when turning head to left.  Able to put chin on chest.  No other s/s noted.  Recent sore throat none today.  Took Ibuprofen 400 mg @ 11am with no relief.

## 2014-07-02 NOTE — Progress Notes (Signed)
Orthopedic Tech Progress Note Patient Details:  Elsie LincolnRonnie Gaona 02/10/1976 [id number]  Ortho Devices Type of Ortho Device: Soft collar Ortho Device/Splint Location: neck Ortho Device/Splint Interventions: Ordered, Application   Jennye MoccasinHughes, Lannis Lichtenwalner Craig 07/02/2014, 9:57 PM

## 2014-07-02 NOTE — ED Notes (Signed)
Provided pt with heat pack.

## 2014-07-04 LAB — CULTURE, GROUP A STREP: STREP A CULTURE: NEGATIVE

## 2014-09-15 ENCOUNTER — Telehealth: Payer: Self-pay | Admitting: General Practice

## 2014-09-15 ENCOUNTER — Encounter: Payer: Medicaid Other | Admitting: Obstetrics and Gynecology

## 2014-09-15 NOTE — Telephone Encounter (Signed)
Called patient in regards to afternoon appt. Appt for f/u on ultrasound that shows ovarian cyst. U/s in February was normal. Called patient to verify appt. Patient states she isn't having pain anymore but someone told her she has fibroids so she wants to discuss that and it also discuss future fertility. Told patient we do not see patient's specifically for fertility but she can discuss that a little bit today with her doctor. Told patient that most recent ultrasound shows no fibroids. Patient is insistent that someone told her. Told patient that's okay we can go over her ultrasound when she gets here. Told patient her appt today is at 3:45 but if she would like to come in sooner she can. Patient verbalized understanding and had no other questions

## 2014-10-12 ENCOUNTER — Encounter: Payer: Medicaid Other | Admitting: Obstetrics and Gynecology

## 2014-10-17 ENCOUNTER — Encounter (HOSPITAL_COMMUNITY): Payer: Self-pay | Admitting: Emergency Medicine

## 2014-10-17 ENCOUNTER — Emergency Department (HOSPITAL_COMMUNITY)
Admission: EM | Admit: 2014-10-17 | Discharge: 2014-10-17 | Disposition: A | Discharge status: Home / Self Care | Payer: Medicaid Other | Attending: Emergency Medicine | Admitting: Emergency Medicine

## 2014-10-17 DIAGNOSIS — M545 Low back pain, unspecified: Secondary | ICD-10-CM

## 2014-10-17 DIAGNOSIS — Z862 Personal history of diseases of the blood and blood-forming organs and certain disorders involving the immune mechanism: Secondary | ICD-10-CM | POA: Insufficient documentation

## 2014-10-17 DIAGNOSIS — Z720 Tobacco use: Secondary | ICD-10-CM | POA: Insufficient documentation

## 2014-10-17 DIAGNOSIS — Z79899 Other long term (current) drug therapy: Secondary | ICD-10-CM | POA: Diagnosis not present

## 2014-10-17 DIAGNOSIS — Z7952 Long term (current) use of systemic steroids: Secondary | ICD-10-CM | POA: Diagnosis not present

## 2014-10-17 DIAGNOSIS — Z3202 Encounter for pregnancy test, result negative: Secondary | ICD-10-CM | POA: Insufficient documentation

## 2014-10-17 DIAGNOSIS — M25512 Pain in left shoulder: Secondary | ICD-10-CM | POA: Insufficient documentation

## 2014-10-17 DIAGNOSIS — N390 Urinary tract infection, site not specified: Secondary | ICD-10-CM | POA: Diagnosis not present

## 2014-10-17 LAB — URINALYSIS, ROUTINE W REFLEX MICROSCOPIC
Bilirubin Urine: NEGATIVE
GLUCOSE, UA: NEGATIVE mg/dL
HGB URINE DIPSTICK: NEGATIVE
Ketones, ur: NEGATIVE mg/dL
Nitrite: NEGATIVE
PH: 7.5 (ref 5.0–8.0)
PROTEIN: NEGATIVE mg/dL
SPECIFIC GRAVITY, URINE: 1.02 (ref 1.005–1.030)
Urobilinogen, UA: 0.2 mg/dL (ref 0.0–1.0)

## 2014-10-17 LAB — URINE MICROSCOPIC-ADD ON

## 2014-10-17 LAB — POC URINE PREG, ED: Preg Test, Ur: NEGATIVE

## 2014-10-17 MED ORDER — CEPHALEXIN 500 MG PO CAPS: 500.0000 mg | ORAL_CAPSULE | Freq: Four times a day (QID) | ORAL | Status: DC

## 2014-10-17 MED ORDER — METHOCARBAMOL 500 MG PO TABS
500.0000 mg | ORAL_TABLET | Freq: Once | ORAL | Status: AC
  Administered 2014-10-17: 500 mg via ORAL
  Filled 2014-10-17: qty 1

## 2014-10-17 MED ORDER — METHOCARBAMOL 500 MG PO TABS: 500.0000 mg | ORAL_TABLET | Freq: Two times a day (BID) | ORAL | Status: DC

## 2014-10-17 MED ORDER — MELOXICAM 15 MG PO TABS: 15.0000 mg | ORAL_TABLET | Freq: Every day | ORAL | Status: DC

## 2014-10-17 MED ORDER — KETOROLAC TROMETHAMINE 60 MG/2ML IM SOLN
60.0000 mg | Freq: Once | INTRAMUSCULAR | Status: AC
  Administered 2014-10-17: 60 mg via INTRAMUSCULAR
  Filled 2014-10-17: qty 2

## 2014-10-17 MED ORDER — CEPHALEXIN 250 MG PO CAPS
500.0000 mg | ORAL_CAPSULE | Freq: Once | ORAL | Status: AC
  Administered 2014-10-17: 500 mg via ORAL
  Filled 2014-10-17: qty 2

## 2014-10-17 NOTE — ED Notes (Signed)
Pt. reports low back pain , "pressure" at end of urination  and posterior neck " stiffness" onset this week , denies injury , no fever or chills.

## 2014-10-17 NOTE — ED Provider Notes (Signed)
CSN: [id number]     Arrival date & time 10/17/14  1850 History  This chart was scribed for [name] ForthHannah Dilara Navarrete, PA-C, working with Derwood KaplanAnkit Nanavati, MD by Elon SpannerGarrett Cook, ED Scribe. This patient was seen in room TR06C/TR06C and the patient's care was started at 7:24 PM.   Chief Complaint  Patient presents with  . Back Pain   The history is provided by the patient. No language interpreter was used.   HPI Comments: [name] LincolnRonnie [name] is a 39 y.o. female who presents to the Emergency Department complaining of gradually worsening, waxing and waning right-sided lower back pain onset 1 week ago.  The pain radiates down to the right buttock.  She descrbes the pain yesterday as pins and needles, localized to the right lower back area without radiation into the leg.  The patient reports the pain is aggravated by walking, laying on the right side, moving from sitting to standing.  She denies injury, new activities, exertional strain.  Shed enies saddle anesthesia, weakness, numbness, gait disturbance or loss of bowel/bladder control.    She also complains of "lump" on her neck with associated mild left-sided neck stiffness onset several months ago.  She denies history of CA, IV drug use.  She does not use anti-coagulants.  She reports she previously took muscle relaxers for this and it resolved for a time.  She denies numbnes or tingling in her arms, weakness or muscle atrophy.  Today, the patient has had a sensation of pressure at the end of urination with some associated cloudy urine.  She denies history of vaginal infection.  She denies vaginal discharge, vaginal bleeding, hematuria, fever, chills, malodorous urine.  She denies upper back pain, fevers, chills, abd pain, nausea, vomiting or other symptoms.     Past Medical History  Diagnosis Date  . Anemia    Past Surgical History  Procedure Laterality Date  . Cesarean section     No family history on file. History  Substance Use Topics  . Smoking status:  Current Every Day Smoker -- 1.00 packs/day    Types: Cigars  . Smokeless tobacco: Not on file  . Alcohol Use: No   OB History    No data available     Review of Systems  Constitutional: Negative for fever, chills, diaphoresis, appetite change, fatigue and unexpected weight change.  HENT: Negative for mouth sores.   Eyes: Negative for visual disturbance.  Respiratory: Negative for cough, chest tightness, shortness of breath and wheezing.   Cardiovascular: Negative for chest pain.  Gastrointestinal: Negative for nausea, vomiting, abdominal pain, diarrhea and constipation.  Endocrine: Negative for polydipsia, polyphagia and polyuria.  Genitourinary: Positive for dysuria. Negative for urgency, frequency, hematuria, vaginal bleeding and vaginal discharge.  Musculoskeletal: Positive for myalgias and back pain. Negative for neck stiffness.  Skin: Negative for rash.  Allergic/Immunologic: Negative for immunocompromised state.  Neurological: Negative for syncope, light-headedness and headaches.  Hematological: Does not bruise/bleed easily.  Psychiatric/Behavioral: Negative for sleep disturbance. The patient is not nervous/anxious.       Allergies  Review of patient's allergies indicates no known allergies.  Home Medications   Prior to Admission medications   Medication Sig Start Date End Date Taking? Authorizing Provider  albuterol (PROVENTIL) (2.5 MG/3ML) 0.083% nebulizer solution Take 3 mLs (2.5 mg total) by nebulization every 4 (four) hours as needed for wheezing. 12/24/13   Reuben Likesavid C Keller, MD  albuterol-ipratropium (COMBIVENT) 18-103 MCG/ACT inhaler Inhale 2 puffs into the lungs every 4 (four) hours as needed for  wheezing or shortness of breath. 12/24/13   Reuben Likes, MD  cephALEXin (KEFLEX) 500 MG capsule Take 1 capsule (500 mg total) by mouth 4 (four) times daily. 10/17/14   Croy Drumwright, PA-C  cyclobenzaprine (FLEXERIL) 5 MG tablet Take 1 tablet (5 mg total) by mouth 2  (two) times daily as needed for muscle spasms. 07/02/14   Tiffany Neva Seat, PA-C  guaiFENesin-codeine (ROBITUSSIN AC) 100-10 MG/5ML syrup Take 5 mLs by mouth 3 (three) times daily as needed for cough. 12/24/13   Reuben Likes, MD  HYDROcodone-acetaminophen (NORCO/VICODIN) 5-325 MG per tablet Take 1-2 tablets by mouth every 6 (six) hours as needed for moderate pain or severe pain. 05/07/14   Gwyneth Sprout, MD  HYDROcodone-acetaminophen (NORCO/VICODIN) 5-325 MG per tablet Take 1-2 tablets by mouth every 4 (four) hours as needed. 07/02/14   Tiffany Neva Seat, PA-C  ibuprofen (ADVIL,MOTRIN) 800 MG tablet Take 1 tablet (800 mg total) by mouth 3 (three) times daily. 08/20/13   Gilda Crease, MD  Ibuprofen-Diphenhydramine Cit (ADVIL PM PO) Take 0.5 tablets by mouth at bedtime as needed (pain).    Historical Provider, MD  ipratropium (ATROVENT) 0.06 % nasal spray Place 2 sprays into both nostrils 4 (four) times daily. 12/24/13   Reuben Likes, MD  meloxicam (MOBIC) 15 MG tablet Take 1 tablet (15 mg total) by mouth daily. 10/17/14   Leocadio Heal, PA-C  methocarbamol (ROBAXIN) 500 MG tablet Take 1 tablet (500 mg total) by mouth 2 (two) times daily. 10/17/14   Milcah Dulany, PA-C  predniSONE (DELTASONE) 10 MG tablet Take 2 tablets (20 mg total) by mouth daily. 07/02/14   Tiffany Neva Seat, PA-C  predniSONE (DELTASONE) 20 MG tablet Take 1 tablet (20 mg total) by mouth 2 (two) times daily. 12/24/13   Reuben Likes, MD   BP 127/76 mmHg  Pulse 63  Temp(Src) 98.2 F (36.8 C) (Oral)  Resp 18  Ht  (1.549 m)  Wt 233 lb (105.688 kg)  BMI 44.05 kg/m2  SpO2 100% Physical Exam  Constitutional: She appears well-developed and well-nourished. No distress.  HENT:  Head: Normocephalic and atraumatic.  Mouth/Throat: Oropharynx is clear and moist. No oropharyngeal exudate.  Eyes: Conjunctivae are normal.  Neck: Normal range of motion. Neck supple.  Full ROM without pain Mild tenderness to palpation and  palpable muscle spasm to the left trapezius and left paraspinal muscles No midline tenderness  Cardiovascular: Normal rate, regular rhythm, normal heart sounds and intact distal pulses.   No murmur heard. Pulmonary/Chest: Effort normal and breath sounds normal. No respiratory distress. She has no wheezes.  Abdominal: Soft. She exhibits no distension. There is no tenderness.  Soft and nontender No CVA tenderness  Musculoskeletal:  Full range of motion of the T-spine and L-spine No tenderness to palpation of the spinous processes of the T-spine or L-spine Mild tenderness to palpation of the right paraspinous muscles of the L-spine and right buttock  Lymphadenopathy:    She has no cervical adenopathy.  Neurological: She is alert. She has normal reflexes.  Reflex Scores:      Bicep reflexes are 2+ on the right side and 2+ on the left side.      Brachioradialis reflexes are 2+ on the right side and 2+ on the left side.      Patellar reflexes are 2+ on the right side and 2+ on the left side.      Achilles reflexes are 2+ on the right side and 2+ on the left side.  Speech is clear and goal oriented, follows commands Normal 5/5 strength in upper and lower extremities bilaterally including dorsiflexion and plantar flexion, strong and equal grip strength Sensation normal to light and sharp touch Moves extremities without ataxia, coordination intact Normal gait Normal balance No Clonus   Skin: Skin is warm and dry. No rash noted. She is not diaphoretic. No erythema.  Psychiatric: She has a normal mood and affect. Her behavior is normal.  Nursing note and vitals reviewed.   ED Course  Procedures (including critical care time)  DIAGNOSTIC STUDIES: Oxygen Saturation is 100% on RA, normal by my interpretation.    COORDINATION OF CARE:  7:38 PM Discussed treatment plan with patient at bedside.  Patient acknowledges and agrees with plan.    Labs Review Labs Reviewed  URINALYSIS, ROUTINE W  REFLEX MICROSCOPIC (NOT AT [facility]) - Abnormal; Notable for the following:    APPearance CLOUDY (*)    Leukocytes, UA MODERATE (*)    All other components within normal limits  URINE MICROSCOPIC-ADD ON - Abnormal; Notable for the following:    Squamous Epithelial / LPF FEW (*)    Bacteria, UA FEW (*)    All other components within normal limits  URINE CULTURE  POC URINE PREG, ED    Imaging Review No results found.   EKG Interpretation None      MDM   Final diagnoses:  Right-sided low back pain without sciatica  UTI (lower urinary tract infection)   Arli Bree presents with right sided low back pain, dysuria and neck pain.  Patient with back pain.  No neurological deficits and normal neuro exam.  Patient can walk without difficulty and most pain is aggravated with moving from sitting to standing.  No loss of bowel or bladder control.  No concern for cauda equina.  No fever, night sweats, weight loss, h/o cancer, IVDU.  Pt without CVA tenderness.  UA with evidence of UTI.  Urine culture sent.  N signs or symptoms of pyelonephritis.  No fever, hypotension nausea or vomiting.  RICE protocol and pain medicine indicated and discussed with patient.  Pt will also be d/c with antibiotics.  BP 127/76 mmHg  Pulse 63  Temp(Src) 98.2 F (36.8 C) (Oral)  Resp 18  Ht 5\' 1"  (1.549 m)  Wt 233 lb (105.688 kg)  BMI 44.05 kg/m2  SpO2 100%  I personally performed the services described in this documentation, which was scribed in my presence. The recorded information has been reviewed and is accurate.    Dahlia Client Damari Suastegui, PA-C 10/17/14 2142  Derwood Kaplan, MD 10/17/14 2253

## 2014-10-17 NOTE — Discharge Instructions (Signed)
1. Medications: robaxin, mobic, keflex, usual home medications 2. Treatment: rest, drink plenty of fluids, gentle stretching as discussed, alternate ice and heat 3. Follow Up: Please followup with your primary doctor in 3 days for discussion of your diagnoses and further evaluation after today's visit; if you do not have a primary care doctor use the resource guide provided to find one;  Return to the ER for worsening back pain, difficulty walking, loss of bowel or bladder control or other concerning symptoms    Back Exercises Back exercises help treat and prevent back injuries. The goal of back exercises is to increase the strength of your abdominal and back muscles and the flexibility of your back. These exercises should be started when you no longer have back pain. Back exercises include:  Pelvic Tilt. Lie on your back with your knees bent. Tilt your pelvis until the lower part of your back is against the floor. Hold this position 5 to 10 sec and repeat 5 to 10 times.  Knee to Chest. Pull first 1 knee up against your chest and hold for 20 to 30 seconds, repeat this with the other knee, and then both knees. This may be done with the other leg straight or bent, whichever feels better.  Sit-Ups or Curl-Ups. Bend your knees 90 degrees. Start with tilting your pelvis, and do a partial, slow sit-up, lifting your trunk only 30 to 45 degrees off the floor. Take at least 2 to 3 seconds for each sit-up. Do not do sit-ups with your knees out straight. If partial sit-ups are difficult, simply do the above but with only tightening your abdominal muscles and holding it as directed.  Hip-Lift. Lie on your back with your knees flexed 90 degrees. Push down with your feet and shoulders as you raise your hips a couple inches off the floor; hold for 10 seconds, repeat 5 to 10 times.  Back arches. Lie on your stomach, propping yourself up on bent elbows. Slowly press on your hands, causing an arch in your low back.  Repeat 3 to 5 times. Any initial stiffness and discomfort should lessen with repetition over time.  Shoulder-Lifts. Lie face down with arms beside your body. Keep hips and torso pressed to floor as you slowly lift your head and shoulders off the floor. Do not overdo your exercises, especially in the beginning. Exercises may cause you some mild back discomfort which lasts for a few minutes; however, if the pain is more severe, or lasts for more than 15 minutes, do not continue exercises until you see your caregiver. Improvement with exercise therapy for back problems is slow.  See your caregivers for assistance with developing a proper back exercise program. Document Released: 04/25/2004 Document Revised: 06/10/2011 Document Reviewed: 01/17/2011 [facility] Patient Information 2015 [employer], [place]. This information is not intended to replace advice given to you by your health care provider. Make sure you discuss any questions you have with your health care provider.    Emergency Department Resource Guide 1) Find a Doctor and Pay Out of Pocket Although you won't have to find out who is covered by your insurance plan, it is a good idea to ask around and get recommendations. You will then need to call the office and see if the doctor you have chosen will accept you as a new patient and what types of options they offer for patients who are self-pay. Some doctors offer discounts or will set up payment plans for their patients who do not have  have insurance, but you will need to ask so you aren't surprised when you get to your appointment. ° °2) Contact Your Local Health Department °Not all health departments have doctors that can see patients for sick visits, but many do, so it is worth a call to see if yours does. If you don't know where your local health department is, you can check in your phone book. The CDC also has a tool to help you locate your state's health department, and many state websites also have  listings of all of their local health departments. ° °3) Find a Walk-in Clinic °If your illness is not likely to be very severe or complicated, you may want to try a walk in clinic. These are popping up all over the country in pharmacies, drugstores, and shopping centers. They're usually staffed by nurse practitioners or physician assistants that have been trained to treat common illnesses and complaints. They're usually fairly quick and inexpensive. However, if you have serious medical issues or chronic medical problems, these are probably not your best option. ° °No Primary Care Doctor: °- Call [facility] at  832-8000 - they can help you locate a primary care doctor that  accepts your insurance, provides certain services, etc. °- Physician Referral Service- 1-800-533-3463 ° °Chronic Pain Problems: °Organization         Address  Phone   Notes  °[facility]  (336) 297-2271 Patients need to be referred by their primary care doctor.  ° °Medication Assistance: °Organization         Address  Phone   Notes  °[place] [facility] [address] °[place], [place] [address] (336) 641-8030 --Must be a resident of [place] °-- Must have NO insurance coverage whatsoever (no Medicaid/ Medicare, etc.) °-- The pt. MUST have a primary care doctor that directs their care regularly and follows them in the community °  °[facility]  (866) 331-1348   °[employer]  (888) 892-1162   ° °Agencies that provide inexpensive medical care: °Organization         Address  Phone   Notes  °[facility]  (336) 832-8035   °[facility]    (336) 832-7272   °[facility] [address] °[place], [place] [address] (336) 832-4777   °[facility] [address], °[place] (336) 271-4999   °[facility]    (336) 373-0678   °[facility]    (336) 272-1050   °[facility] ° [address], [place] Phone:  (336) 832-4444, Fax:  (336) 832-4440 Hours of Operation:  9 am - 6 pm, M-F.  Also accepts Medicaid/Medicare and self-pay.  °[facility] ° [address], [place] Phone: (336) 832-3150, Fax: (336) 832-3151. Hours of Operation:  8:30 am - 5:30 pm, M-F.  Also accepts Medicaid and self-pay.  °[facility] [place] [address], [place] Phone: (336) 878-6027   °[facility] [address], [place],  (336)723-1848, Ext. 123 Mondays & Thursdays: 7-9 AM.  First 15 patients are seen on a first come, first serve basis. °  ° °Medicaid-accepting [place] Providers: ° °Organization         Address  Phone   Notes  °[facility] [address],  (336) 641-2100 Also accepts self-pay patients.  °[facility] [address], [place] ° (336) 856-9996   °[facility] [address], [place] (336) 288-8857   °[facility] [address] [place] [address], [place] (336) 299-7000   °Veita Bland [address], [place]  ° (336) 373-1557 Only accepts [place] Access Medicaid patients after they have their name applied to their card.  ° °Self-Pay (no insurance) in [place]: ° °Organization         Address  Phone   Notes  °Sickle Cell Patients, [facility] [address], [place] (336) 832-1970   °[facility] [address], [place] (336) 832-4400   ° [facility] ° [address], [place] (336) 992-4800   °[facility]/Dr. Osei-Bonsu ° [address] [place] [address], [place] or [address], [place] (336) 841-8500 Phone number for both [place] and [place] locations is the same.  °[facility] [address], [place] (336) 299-0000   °[facility] [address] [place] [address],  [place] or [address] (336) 852-7530 °(336) 878-2260   °[facility] [address], [place] (336) 350-1642, phone; (336) 294-5005, fax Sees patients 1st and 3rd Saturday of every month.  Must not qualify for public or private insurance (i.e. Medicaid, Medicare, [place] Health Choice, Veterans' Benefits) • Household income should be no more than 200% of the poverty level •The clinic cannot treat you if you are pregnant or think you are pregnant • Sexually transmitted diseases are not treated at the clinic.  ° ° °Dental Care: °Organization         Address  Phone  Notes  °[facility] [facility] [address], [place] (336) 641-6152 Accepts children up to age 21 who are enrolled in Medicaid or [place] Health Choice; pregnant women with a Medicaid card; and children who have applied for Medicaid or [place] Health Choice, but were declined, whose parents can pay a reduced fee at time of service.  °[facility] [place]  [address], [place] (336) 641-7733 Accepts children up to age 21 who are enrolled in Medicaid or [place] Health Choice; pregnant women with a Medicaid card; and children who have applied for Medicaid or  Health Choice, but were declined, whose parents can pay a reduced fee at time of service.  °[facility] ° [address], [place] (336) 641-4533 Patients are seen by appointment only. Walk-ins are not accepted. [facility] will see patients 18 years of age and older. °Monday - Tuesday (8am-5pm) °Most Wednesdays (8:30-5pm) °$30 per visit, cash only  °[facility] ° [address], [place] (336) 641-4533 Patients are seen by appointment only. Walk-ins are not accepted. [facility] will see patients 18 years of age and older. °One Wednesday Evening (Monthly: Volunteer Based).  $30 per visit, cash only  °[facility]  (919) 537-3737 for adults; Children under age 4, call Graduate Pediatric Dentistry at (919) [phone]. Children aged 4-14, please call (919) [phone] to request a pediatric application. ° Dental services are provided in all areas of dental care including fillings, crowns and bridges, complete and partial dentures, implants, gum treatment, root canals, and extractions. Preventive care is also provided. Treatment is provided to both adults and children. °Patients are selected   via a lottery and there is often a waiting list. °  °[facility] [address], °[place] ° (336) 763-8833 [email or web address] °  °[facility] [address], [place], [place] (336)723-1848, Ext. 123 Second and Fourth Thursday of each month, opens at 6:30 AM; Clinic ends at 9 AM.  Patients are seen on a first-come first-served basis, and a limited number are seen during each clinic.  ° °[facility] ° [address], [place], [place] (336) 723-7904   Eligibility Requirements °You must have lived in [place], [place], or [place] counties for at least the last three months. °  You cannot be eligible for state or federal sponsored healthcare insurance, including Veterans Administration, Medicaid, or Medicare. °  You generally cannot be eligible for healthcare insurance through your employer.  °  How to apply: °Eligibility screenings are held every Tuesday and Wednesday afternoon from 1:00 pm until 4:00 pm. You do not need an appointment for the interview!  °[facility] [address], [place], [place] 336-631-2330   °[facility] [facility]  336-342-8273   °[facility]  336-703-3100   ° [facility]  336-570-6415   ° °Behavioral Health Resources in the Community: °Intensive Outpatient Programs °Organization         Address  Phone  Notes  °[facility] [address], [place], [place] 336-878-6098     °[facility] [address], [place], [place] 336-832-9800   °[facility] [address], [place], [place] ° 336-882-2125   °[facility] [address],  °[place], [place] 1-800-853-5163 or 336-641-4981   °Substance Abuse Resources °Organization         Address  Phone  Notes  °[facility]  336-882-2125   °[facility]  336-784-9470   °[facility]  336-285-9073   °[facility]  336-845-3988   °Residential & Outpatient Substance Abuse Program  1-800-659-3381   °Psychological Services °Organization         Address  Phone  Notes  °[facility]  336- 832-9600   °[facility]  336- 378-7881   °[facility] [address], [place] 1-800-853-5163 or 336-641-4981   ° °Mobile Crisis Teams °Organization         Address  Phone  Notes  °[facility], Mobile Crisis Care Unit  1-877-626-1772   °Assertive °[facility] ° [address] [place], [place] 336-834-9664   °Sharon DeEsch [address] °[place] 336-554-5454   ° °Self-Help/Support Groups °Organization         Address  Phone             Notes  °[facility] - variety of support groups  336- 373-1402 Call for more information  °Narcotics Anonymous (NA), [facility] [address], °[place]  2 meetings at this location  ° °Residential Treatment Programs °Organization         Address  Phone  Notes  °[facility] [address],    ° [place]  1-866-801-8205   °[facility] ° [address], [place], [place] 704-293-8524   °[facility] [address], [place] 336-845-3988 Admissions: 8am-3pm M-F  °[facility] [address]    °[place],  336-841-1104   °[facility] [address], [place], [place] 336-379-7146   °[facility] [address]  °[place],  [place] 336-285-9073   °[facility] - Intensive Outpatient [address], [place], [place] 336-852-3033   °[facility] [address]  °[place], [place] 1-877-615-2722 or 336-784-9470   °[facility] [address] [place], [place] 336-227-7417 Accepts Medicaid  °[facility] [address]  °[place] 1-800-659-3381 Substance Abuse/Addiction Treatment  ° °[place] Behavioral Health Resources °Organization         Address  Phone  Notes  °[facility]  (888) 581-9988   °Julie Brannon, PhD [address] [place], [place]   (336) 349-5553 or (336) 951-0000   °[facility]   [address] °[place], [place] (336) 349-4454   °[facility] [address], [place], [place] (336) 342-8316 Insurance/Medicaid/sponsorship through [facility]  °[facility] [address]                                    [place], [place] (336) 342-8316 Therapy/tele-psych/case  °[facility] [address]  ° [place], [place] (336) 349-2233    °Dr. Arfeen  (336) 349-4544   °[facility] [place]  [employer] [place] [facility] 1) [address], [place] °2) [address], [place] °3)  [address], [place] (336) 349-3220 °(336) 342-7768 ° °(336) 342-8140   °[place] [facility] (336) 342-1394 or (336) 342-3537 (After Hours)    ° ° ° °

## 2014-10-17 NOTE — ED Notes (Signed)
PA at the bedside.

## 2014-10-20 LAB — URINE CULTURE: Culture: >=100000

## 2014-10-21 ENCOUNTER — Telehealth (HOSPITAL_COMMUNITY): Payer: Self-pay

## 2014-10-21 NOTE — ED Notes (Signed)
Post ED Visit - Positive Culture Follow-up  Culture report reviewed by antimicrobial stewardship pharmacist:  Wes Dulaney, Pharm.D., BCPS  Celedonio Miyamoto, Pharm.D., BCPS  Georgina Pillion, [address].D., BCPS  [place], [address].D., BCPS, AAHIVP  Estella Husk, Pharm.D., BCPS, AAHIVP  Elder Cyphers, [address].D., BCPS  Positive urine culture Treated with cephalexin, organism sensitive to the same and no further patient follow-up is required at this time.  Ashley Jacobs 10/21/2014, 8:53 AM

## 2015-01-24 ENCOUNTER — Encounter (HOSPITAL_COMMUNITY): Payer: Self-pay | Admitting: Emergency Medicine

## 2015-01-24 ENCOUNTER — Emergency Department (HOSPITAL_COMMUNITY)
Admission: EM | Admit: 2015-01-24 | Discharge: 2015-01-24 | Disposition: A | Discharge status: Home / Self Care | Payer: Medicaid Other | Attending: Emergency Medicine | Admitting: Emergency Medicine

## 2015-01-24 DIAGNOSIS — X58XXXA Exposure to other specified factors, initial encounter: Secondary | ICD-10-CM | POA: Diagnosis not present

## 2015-01-24 DIAGNOSIS — Y999 Unspecified external cause status: Secondary | ICD-10-CM | POA: Insufficient documentation

## 2015-01-24 DIAGNOSIS — Z7952 Long term (current) use of systemic steroids: Secondary | ICD-10-CM | POA: Insufficient documentation

## 2015-01-24 DIAGNOSIS — M549 Dorsalgia, unspecified: Secondary | ICD-10-CM | POA: Insufficient documentation

## 2015-01-24 DIAGNOSIS — Y929 Unspecified place or not applicable: Secondary | ICD-10-CM | POA: Diagnosis not present

## 2015-01-24 DIAGNOSIS — Y939 Activity, unspecified: Secondary | ICD-10-CM | POA: Diagnosis not present

## 2015-01-24 DIAGNOSIS — Z792 Long term (current) use of antibiotics: Secondary | ICD-10-CM | POA: Diagnosis not present

## 2015-01-24 DIAGNOSIS — S46911A Strain of unspecified muscle, fascia and tendon at shoulder and upper arm level, right arm, initial encounter: Secondary | ICD-10-CM | POA: Diagnosis not present

## 2015-01-24 DIAGNOSIS — Z791 Long term (current) use of non-steroidal anti-inflammatories (NSAID): Secondary | ICD-10-CM | POA: Insufficient documentation

## 2015-01-24 DIAGNOSIS — Z862 Personal history of diseases of the blood and blood-forming organs and certain disorders involving the immune mechanism: Secondary | ICD-10-CM | POA: Diagnosis not present

## 2015-01-24 DIAGNOSIS — Z720 Tobacco use: Secondary | ICD-10-CM | POA: Diagnosis not present

## 2015-01-24 DIAGNOSIS — M542 Cervicalgia: Secondary | ICD-10-CM | POA: Diagnosis present

## 2015-01-24 DIAGNOSIS — S46811A Strain of other muscles, fascia and tendons at shoulder and upper arm level, right arm, initial encounter: Secondary | ICD-10-CM

## 2015-01-24 MED ORDER — METHOCARBAMOL 500 MG PO TABS: 500.0000 mg | ORAL_TABLET | Freq: Two times a day (BID) | ORAL | Status: DC

## 2015-01-24 NOTE — Discharge Instructions (Signed)

## 2015-01-24 NOTE — ED Notes (Signed)
Pt sts neck and back pain x 3 days; pt sts fatigue today

## 2015-01-24 NOTE — ED Provider Notes (Signed)
CSN: [id number]     Arrival date & time 01/24/15  1619 History  By signing my name below, I, Soijett Blue, attest that this documentation has been prepared under the direction and in the presence of Burna FortsJeff Daquan Crapps, PA-C Electronically Signed: Soijett Blue, ED Scribe. 01/24/2015. 5:37 PM.   Chief Complaint  Patient presents with  . Neck Pain  . Back Pain      The history is provided by the patient. No language interpreter was used.    [name] [name] is a 39 y.o. female who presents to the Emergency Department complaining of intermittent neck pain/tightness onset 3 days. She notes that her pain comes at any time of the day and it is not with any specific activities. She notes that she has been seen in the ED for the issues with her neck. 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 Pt is having associated symptoms of intermittent back pain described as pins in the back. She denies any other symptoms.      Past Medical History  Diagnosis Date  . Anemia    Past Surgical History  Procedure Laterality Date  . Cesarean section     History reviewed. No pertinent family history. Social History  Substance Use Topics  . Smoking status: Current Every Day Smoker -- 1.00 packs/day    Types: Cigars  . Smokeless tobacco: None  . Alcohol Use: No   OB History    No data available     Review of Systems  All other systems reviewed and are negative.     Allergies  Review of patient's allergies indicates no known allergies.  Home Medications   Prior to Admission medications   Medication Sig Start Date End Date Taking? Authorizing Provider  albuterol (PROVENTIL) (2.5 MG/3ML) 0.083% nebulizer solution Take 3 mLs (2.5 mg total) by nebulization every 4 (four) hours as needed for wheezing. 12/24/13   Reuben Likesavid C Keller, MD  albuterol-ipratropium (COMBIVENT) 18-103 MCG/ACT inhaler Inhale 2 puffs into the lungs every 4 (four) hours as needed for wheezing or shortness  of breath. 12/24/13   Reuben Likesavid C Keller, MD  cephALEXin (KEFLEX) 500 MG capsule Take 1 capsule (500 mg total) by mouth 4 (four) times daily. 10/17/14   Hannah Muthersbaugh, PA-C  cyclobenzaprine (FLEXERIL) 5 MG tablet Take 1 tablet (5 mg total) by mouth 2 (two) times daily as needed for muscle spasms. 07/02/14   Tiffany Neva SeatGreene, PA-C  guaiFENesin-codeine (ROBITUSSIN AC) 100-10 MG/5ML syrup Take 5 mLs by mouth 3 (three) times daily as needed for cough. 12/24/13   Reuben Likesavid C Keller, MD  HYDROcodone-acetaminophen (NORCO/VICODIN) 5-325 MG per tablet Take 1-2 tablets by mouth every 6 (six) hours as needed for moderate pain or severe pain. 05/07/14   Gwyneth SproutWhitney Plunkett, MD  HYDROcodone-acetaminophen (NORCO/VICODIN) 5-325 MG per tablet Take 1-2 tablets by mouth every 4 (four) hours as needed. 07/02/14   Tiffany Neva SeatGreene, PA-C  ibuprofen (ADVIL,MOTRIN) 800 MG tablet Take 1 tablet (800 mg total) by mouth 3 (three) times daily. 08/20/13   Gilda Creasehristopher J Pollina, MD  Ibuprofen-Diphenhydramine Cit (ADVIL PM PO) Take 0.5 tablets by mouth at bedtime as needed (pain).    Historical Provider, MD  ipratropium (ATROVENT) 0.06 % nasal spray Place 2 sprays into both nostrils 4 (four) times daily. 12/24/13   Reuben Likesavid C Keller, MD  meloxicam (MOBIC) 15 MG tablet Take 1 tablet (15 mg total) by mouth daily. 10/17/14   Hannah Muthersbaugh, PA-C  methocarbamol (ROBAXIN) 500 MG  tablet Take 1 tablet (500 mg total) by mouth 2 (two) times daily. 01/24/15   Eyvonne Mechanic, PA-C  predniSONE (DELTASONE) 10 MG tablet Take 2 tablets (20 mg total) by mouth daily. 07/02/14   Tiffany Neva Seat, PA-C  predniSONE (DELTASONE) 20 MG tablet Take 1 tablet (20 mg total) by mouth 2 (two) times daily. 12/24/13   Reuben Likes, MD   BP 144/86 mmHg  Pulse 76  Temp(Src) 98 F (36.7 C) (Oral)  Resp 16  Ht  (1.549 m)  Wt 230 lb (104.327 kg)  BMI 43.48 kg/m2  SpO2 100% Physical Exam  Constitutional: She is oriented to person, place, and time. She appears well-developed  and well-nourished. No distress.  HENT:  Head: Normocephalic.  Neck: Normal range of motion. Neck supple.  Pulmonary/Chest: Effort normal.  Musculoskeletal: Normal range of motion. She exhibits tenderness. She exhibits no edema.  No C, T, or L spine tenderness to palpation. Tenderness to the right trapezius. No obvious signs of trauma, deformity, infection, step-offs. Lung expansion normal. No scoliosis or kyphosis. Bilateral lower extremity strength 5 out of 5, sensation grossly intact, patellar reflexes 2+, pedal pulses 2+, Refill less than 3 seconds.   Ambulates with no difficulty.   Neurological: She is alert and oriented to person, place, and time.  Skin: Skin is warm and dry. She is not diaphoretic.  Psychiatric: She has a normal mood and affect. Her behavior is normal. Judgment and thought content normal.  Nursing note and vitals reviewed.   ED Course  Procedures (including critical care time) DIAGNOSTIC STUDIES: Oxygen Saturation is 100% on RA, nl by my interpretation.    COORDINATION OF CARE: 5:20 PM Discussed treatment plan with pt at bedside which includes heat, muscle relaxer Rx, massage, and pt agreed to plan.   Labs Review Labs Reviewed - No data to display  Imaging Review No results found.    EKG Interpretation None      MDM   Final diagnoses:  Trapezius muscle strain, right, initial encounter    Labs:   Imaging:   Consults:   Therapeutics:   Discharge Meds: robaxin Rx  Assessment/Plan: patient presentation most consisted with muscular pain likely due to tension. Pt with full active ROM of the upper extremity. No neur or vascular deficits. No ob sign of trauma, rash, or any other concerning findings. Patient will be instructed to use ibuprofen, robaxin, and massage.  Pt verbalized understanding and agreement to today's plan and had no further questions or concerns at this time.     I personally performed the services described in this  documentation, which was scribed in my presence. The recorded information has been reviewed and is accurate.      Eyvonne Mechanic, PA-C 01/24/15 1743  Leta Baptist, MD 01/25/15 [phone]

## 2015-12-12 ENCOUNTER — Emergency Department (HOSPITAL_COMMUNITY): Payer: Medicaid Other

## 2015-12-12 ENCOUNTER — Emergency Department (HOSPITAL_COMMUNITY)
Admission: EM | Admit: 2015-12-12 | Discharge: 2015-12-12 | Disposition: A | Discharge status: Home / Self Care | Payer: Medicaid Other | Attending: Emergency Medicine | Admitting: Emergency Medicine

## 2015-12-12 ENCOUNTER — Encounter (HOSPITAL_COMMUNITY): Payer: Self-pay

## 2015-12-12 DIAGNOSIS — F17210 Nicotine dependence, cigarettes, uncomplicated: Secondary | ICD-10-CM | POA: Insufficient documentation

## 2015-12-12 DIAGNOSIS — M542 Cervicalgia: Secondary | ICD-10-CM | POA: Diagnosis present

## 2015-12-12 MED ORDER — HYDROCODONE-ACETAMINOPHEN 5-325 MG PO TABS
2.0000 | ORAL_TABLET | Freq: Once | ORAL | Status: AC
  Administered 2015-12-12: 2 via ORAL
  Filled 2015-12-12: qty 2

## 2015-12-12 MED ORDER — HYDROCODONE-ACETAMINOPHEN 5-325 MG PO TABS: 1.0000 | ORAL_TABLET | Freq: Four times a day (QID) | ORAL | 0 refills | Status: DC | PRN

## 2015-12-12 MED ORDER — NAPROXEN 500 MG PO TABS
500.0000 mg | ORAL_TABLET | Freq: Two times a day (BID) | ORAL | 0 refills | Status: DC
Start: 2015-12-12 — End: 2016-12-07

## 2015-12-12 MED ORDER — KETOROLAC TROMETHAMINE 60 MG/2ML IM SOLN
60.0000 mg | Freq: Once | INTRAMUSCULAR | Status: AC
  Administered 2015-12-12: 60 mg via INTRAMUSCULAR
  Filled 2015-12-12: qty 2

## 2015-12-12 NOTE — ED Provider Notes (Signed)
[facility]EM[facility]Y DEPT Provider Note   CSN: [id number] Arrival date & time: 12/12/15  2046     History   Chief Complaint Chief Complaint  Patient presents with  . Headache  . Neck Pain    HPI [name] [name] is a 40 y.o. female.  Patient is a 40 year old female with history of anemia. She presents for evaluation of pain in the back of her neck that started 4 days ago. This began in the absence of any injury or trauma. She denies any radiation of her pain into her arm. She denies any headache or visual disturbances.   The history is provided by the patient.  Neck Pain   This is a new problem. Episode onset: 4 days ago. The problem occurs constantly. The problem has been gradually worsening. The pain is associated with nothing. There has been no fever. The pain is moderate. Pertinent negatives include no bowel incontinence, no bladder incontinence, no tingling and no weakness.    Past Medical History:  Diagnosis Date  . Anemia     There are no active problems to display for this patient.   Past Surgical History:  Procedure Laterality Date  . CESAREAN SECTION      OB History    No data available       Home Medications    Prior to Admission medications   Medication Sig Start Date End Date Taking? Authorizing Provider  albuterol (PROVENTIL) (2.5 MG/3ML) 0.083% nebulizer solution Take 3 mLs (2.5 mg total) by nebulization every 4 (four) hours as needed for wheezing. 12/24/13   Reuben Likesavid C Keller, MD  albuterol-ipratropium (COMBIVENT) 18-103 MCG/ACT inhaler Inhale 2 puffs into the lungs every 4 (four) hours as needed for wheezing or shortness of breath. 12/24/13   Reuben Likesavid C Keller, MD  cephALEXin (KEFLEX) 500 MG capsule Take 1 capsule (500 mg total) by mouth 4 (four) times daily. 10/17/14   Hannah Muthersbaugh, PA-C  cyclobenzaprine (FLEXERIL) 5 MG tablet Take 1 tablet (5 mg total) by mouth 2 (two) times daily as needed for muscle spasms. 07/02/14   Tiffany Neva SeatGreene, PA-C    guaiFENesin-codeine (ROBITUSSIN AC) 100-10 MG/5ML syrup Take 5 mLs by mouth 3 (three) times daily as needed for cough. 12/24/13   Reuben Likesavid C Keller, MD  HYDROcodone-acetaminophen (NORCO/VICODIN) 5-325 MG per tablet Take 1-2 tablets by mouth every 6 (six) hours as needed for moderate pain or severe pain. 05/07/14   Gwyneth SproutWhitney Plunkett, MD  HYDROcodone-acetaminophen (NORCO/VICODIN) 5-325 MG per tablet Take 1-2 tablets by mouth every 4 (four) hours as needed. 07/02/14   Tiffany Neva SeatGreene, PA-C  ibuprofen (ADVIL,MOTRIN) 800 MG tablet Take 1 tablet (800 mg total) by mouth 3 (three) times daily. 08/20/13   Gilda Creasehristopher J Pollina, MD  Ibuprofen-Diphenhydramine Cit (ADVIL PM PO) Take 0.5 tablets by mouth at bedtime as needed (pain).    Historical Provider, MD  ipratropium (ATROVENT) 0.06 % nasal spray Place 2 sprays into both nostrils 4 (four) times daily. 12/24/13   Reuben Likesavid C Keller, MD  meloxicam (MOBIC) 15 MG tablet Take 1 tablet (15 mg total) by mouth daily. 10/17/14   Hannah Muthersbaugh, PA-C  methocarbamol (ROBAXIN) 500 MG tablet Take 1 tablet (500 mg total) by mouth 2 (two) times daily. 01/24/15   Eyvonne MechanicJeffrey Hedges, PA-C  predniSONE (DELTASONE) 10 MG tablet Take 2 tablets (20 mg total) by mouth daily. 07/02/14   Tiffany Neva SeatGreene, PA-C  predniSONE (DELTASONE) 20 MG tablet Take 1 tablet (20 mg total) by mouth 2 (two) times daily. 12/24/13   Onalee Huaavid  Vivia Budge, MD    Family History History reviewed. No pertinent family history.  Social History Social History  Substance Use Topics  . Smoking status: Current Every Day Smoker    Packs/day: 1.00    Types: Cigars  . Smokeless tobacco: Never Used  . Alcohol use No     Allergies   Review of patient's allergies indicates no known allergies.   Review of Systems Review of Systems  Gastrointestinal: Negative for bowel incontinence.  Genitourinary: Negative for bladder incontinence.  Musculoskeletal: Positive for neck pain.  Neurological: Negative for tingling and weakness.   All other systems reviewed and are negative.    Physical Exam Updated Vital Signs BP 148/82   Pulse 65   Temp 98.1 F (36.7 C) (Oral)   Resp 16   LMP 12/05/2015   SpO2 98%   Physical Exam  Constitutional: She is oriented to person, place, and time. She appears well-developed and well-nourished. No distress.  HENT:  Head: Normocephalic and atraumatic.  Neck: Normal range of motion. Neck supple.  There is ttp in the right posterior aspect of the neck. There is no palpable abnormality.  Cardiovascular: Normal rate and regular rhythm.  Exam reveals no gallop and no friction rub.   No murmur heard. Pulmonary/Chest: Effort normal and breath sounds normal. No respiratory distress. She has no wheezes.  Abdominal: Soft. Bowel sounds are normal. She exhibits no distension. There is no tenderness.  Musculoskeletal: Normal range of motion.  Neurological: She is alert and oriented to person, place, and time.  Skin: Skin is warm and dry. She is not diaphoretic.  Nursing note and vitals reviewed.    ED Treatments / Results  Labs (all labs ordered are listed, but only abnormal results are displayed) Labs Reviewed - No data to display  EKG  EKG Interpretation None       Radiology No results found.  Procedures Procedures (including critical care time)  Medications Ordered in ED Medications  ketorolac (TORADOL) injection 60 mg (not administered)  HYDROcodone-acetaminophen (NORCO/VICODIN) 5-325 MG per tablet 2 tablet (not administered)     Initial Impression / Assessment and Plan / ED Course  I have reviewed the triage vital signs and the nursing notes.  Pertinent labs & imaging results that were available during my care of the patient were reviewed by me and considered in my medical decision making (see chart for details).  Clinical Course    The patient presents complaining of neck pain. She is neurovascularly intact to both upper extremities. Her x-rays reveal only  mild degenerative changes. She is feeling better after Toradol and pain medication. She will be discharged with anti-inflammatories, pain medication, when necessary follow-up with her primary Dr. if she's not improving in the next week.  Final Clinical Impressions(s) / ED Diagnoses   Final diagnoses:  None    New Prescriptions New Prescriptions   No medications on file     Geoffery Lyons, MD 12/12/15 2328

## 2015-12-12 NOTE — Discharge Instructions (Signed)
Naproxen as prescribed.  Hydrocodone as prescribed as needed for pain not relieved with naproxen.  Return to the emergency department if your symptoms significantly worsen or change.

## 2015-12-12 NOTE — ED Triage Notes (Signed)
Pt complaining of R side neck and head pain x 4 days. Pt states hx of migraines.

## 2016-03-15 ENCOUNTER — Encounter (HOSPITAL_COMMUNITY): Payer: Self-pay

## 2016-03-15 ENCOUNTER — Ambulatory Visit (HOSPITAL_COMMUNITY)
Admission: EM | Admit: 2016-03-15 | Discharge: 2016-03-15 | Disposition: A | Discharge status: Home / Self Care | Payer: Medicaid Other | Attending: Internal Medicine | Admitting: Internal Medicine

## 2016-03-15 DIAGNOSIS — T148XXA Other injury of unspecified body region, initial encounter: Secondary | ICD-10-CM | POA: Diagnosis present

## 2016-03-15 DIAGNOSIS — M542 Cervicalgia: Secondary | ICD-10-CM | POA: Diagnosis present

## 2016-03-15 HISTORY — DX: Unspecified osteoarthritis, unspecified site: M19.90

## 2016-03-15 MED ORDER — METHOCARBAMOL 500 MG PO TABS: 500.0000 mg | ORAL_TABLET | Freq: Two times a day (BID) | ORAL | 0 refills | Status: AC

## 2016-03-15 NOTE — ED Provider Notes (Signed)
CSN: [id number]     Arrival date & time 03/15/16  1349 History   First MD Initiated Contact with Patient 03/15/16 1528     Chief Complaint  Patient presents with  . Back Pain  . Neck Pain   (Consider location/radiation/quality/duration/timing/severity/associated sxs/prior Treatment) Presents with chief complaint of neck pain. Patient states she has had neck pain for past several months but pain has worsened over the past week.  Denies history of trauma, describes pain as throbbing, worse with movement, and non-radiating. States she has been seen for 3 times for neck pain over previous several months without relief.   The history is provided by the patient.  Neck Injury  This is a chronic problem. The current episode started more than 1 week ago. The problem occurs daily. The problem has not changed since onset.Pertinent negatives include no headaches. The symptoms are aggravated by bending and twisting. The symptoms are relieved by medications, acetaminophen, position, relaxation and rest. She has tried acetaminophen, rest and a warm compress for the symptoms. The treatment provided mild relief.    Past Medical History:  Diagnosis Date  . Anemia   . Arthritis    Past Surgical History:  Procedure Laterality Date  . CESAREAN SECTION     History reviewed. No pertinent family history. Social History  Substance Use Topics  . Smoking status: Current Every Day Smoker    Packs/day: 1.00    Types: Cigars  . Smokeless tobacco: Never Used  . Alcohol use No   OB History    No data available     Review of Systems  Constitutional: Negative for fatigue and fever.  HENT: Negative for congestion, sinus pain and sinus pressure.   Musculoskeletal: Positive for back pain, neck pain and neck stiffness. Negative for joint swelling.  Neurological: Negative for dizziness, syncope, weakness, numbness and headaches.    Allergies  Patient has no known allergies.  Home Medications   Prior to  Admission medications   Medication Sig Start Date End Date Taking? Authorizing Provider  albuterol (PROVENTIL) (2.5 MG/3ML) 0.083% nebulizer solution Take 3 mLs (2.5 mg total) by nebulization every 4 (four) hours as needed for wheezing. 12/24/13  Yes Reuben Likesavid C Keller, MD  albuterol-ipratropium (COMBIVENT) 18-103 MCG/ACT inhaler Inhale 2 puffs into the lungs every 4 (four) hours as needed for wheezing or shortness of breath. 12/24/13   Reuben Likesavid C Keller, MD  cephALEXin (KEFLEX) 500 MG capsule Take 1 capsule (500 mg total) by mouth 4 (four) times daily. 10/17/14   Hannah Muthersbaugh, PA-C  cyclobenzaprine (FLEXERIL) 5 MG tablet Take 1 tablet (5 mg total) by mouth 2 (two) times daily as needed for muscle spasms. 07/02/14   Tiffany Neva SeatGreene, PA-C  guaiFENesin-codeine (ROBITUSSIN AC) 100-10 MG/5ML syrup Take 5 mLs by mouth 3 (three) times daily as needed for cough. 12/24/13   Reuben Likesavid C Keller, MD  HYDROcodone-acetaminophen (NORCO) 5-325 MG tablet Take 1-2 tablets by mouth every 6 (six) hours as needed. 12/12/15   Geoffery Lyonsouglas Delo, MD  ibuprofen (ADVIL,MOTRIN) 800 MG tablet Take 1 tablet (800 mg total) by mouth 3 (three) times daily. 08/20/13   Gilda Creasehristopher J Pollina, MD  Ibuprofen-Diphenhydramine Cit (ADVIL PM PO) Take 0.5 tablets by mouth at bedtime as needed (pain).    Historical Provider, MD  ipratropium (ATROVENT) 0.06 % nasal spray Place 2 sprays into both nostrils 4 (four) times daily. 12/24/13   Reuben Likesavid C Keller, MD  meloxicam (MOBIC) 15 MG tablet Take 1 tablet (15 mg total) by mouth daily.  10/17/14   Hannah Muthersbaugh, PA-C  methocarbamol (ROBAXIN) 500 MG tablet Take 1 tablet (500 mg total) by mouth 2 (two) times daily. 01/24/15   Eyvonne MechanicJeffrey Hedges, PA-C  methocarbamol (ROBAXIN) 500 MG tablet Take 1 tablet (500 mg total) by mouth 2 (two) times daily. 03/15/16 03/22/16  Dorena BodoLawrence Aleric Froelich, NP  naproxen (NAPROSYN) 500 MG tablet Take 1 tablet (500 mg total) by mouth 2 (two) times daily. 12/12/15   Geoffery Lyonsouglas Delo, MD  predniSONE  (DELTASONE) 10 MG tablet Take 2 tablets (20 mg total) by mouth daily. 07/02/14   Tiffany Neva SeatGreene, PA-C  predniSONE (DELTASONE) 20 MG tablet Take 1 tablet (20 mg total) by mouth 2 (two) times daily. 12/24/13   Reuben Likesavid C Keller, MD   Meds Ordered and Administered this Visit  Medications - No data to display  BP 136/73 (BP Location: Right Arm)   Pulse 64   Temp 98 F (36.7 C) (Oral)   Resp 15   LMP 03/05/2016 (Exact Date)   SpO2 100%  No data found.   Physical Exam  Constitutional: She is oriented to person, place, and time. She appears well-developed and well-nourished. No distress.  HENT:  Head: Normocephalic.  Right Ear: External ear normal.  Neck: Normal range of motion. Neck supple. Muscular tenderness present. No spinous process tenderness present. No neck rigidity. Normal range of motion present.    Cardiovascular: Normal rate, regular rhythm and normal heart sounds.   Pulses:      Radial pulses are 2+ on the right side.  Pulmonary/Chest: Effort normal and breath sounds normal.  Neurological: She is alert and oriented to person, place, and time.  Skin: Skin is warm and dry. Capillary refill takes less than 2 seconds.  Psychiatric: She has a normal mood and affect.    Urgent Care Course   Clinical Course     Procedures (including critical care time)  Labs Review Labs Reviewed - No data to display  Imaging Review No results found.   Visual Acuity Review  Right Eye Distance:   Left Eye Distance:   Bilateral Distance:    Right Eye Near:   Left Eye Near:    Bilateral Near:         MDM   1. Neck pain   2. Muscle strain    Non-traumatic neck pain for several months, worsened over previous week. Advised warm compresses, OTC tylenol or ibuprofen, given RX for Robaxin. Advised to follow-up with PCP should symptoms fail to resolve.     Dorena BodoLawrence Ercel Pepitone, NP 03/15/16 [phone]

## 2016-03-15 NOTE — ED Triage Notes (Signed)
[facility] with complaints of upper back and neck pain x1 week, pt complains of sharp stabbing pain in back, patient has been taking all day PM to treat pain with no relief

## 2016-04-22 ENCOUNTER — Emergency Department (HOSPITAL_COMMUNITY)
Admission: EM | Admit: 2016-04-22 | Discharge: 2016-04-22 | Discharge status: Absent without leave | Payer: Medicaid Other

## 2016-04-22 NOTE — ED Triage Notes (Signed)
Called for triage no response 

## 2016-07-07 ENCOUNTER — Encounter (HOSPITAL_COMMUNITY): Payer: Self-pay

## 2016-07-07 ENCOUNTER — Emergency Department (HOSPITAL_COMMUNITY)
Admission: EM | Admit: 2016-07-07 | Discharge: 2016-07-07 | Disposition: A | Discharge status: Home / Self Care | Payer: Medicaid Other | Attending: Emergency Medicine | Admitting: Emergency Medicine

## 2016-07-07 DIAGNOSIS — M62838 Other muscle spasm: Secondary | ICD-10-CM

## 2016-07-07 DIAGNOSIS — F17290 Nicotine dependence, other tobacco product, uncomplicated: Secondary | ICD-10-CM | POA: Insufficient documentation

## 2016-07-07 DIAGNOSIS — M542 Cervicalgia: Secondary | ICD-10-CM | POA: Diagnosis present

## 2016-07-07 MED ORDER — METHOCARBAMOL 500 MG PO TABS: 500.0000 mg | ORAL_TABLET | Freq: Two times a day (BID) | ORAL | 0 refills | Status: DC

## 2016-07-07 MED ORDER — KETOROLAC TROMETHAMINE 30 MG/ML IJ SOLN
30.0000 mg | Freq: Once | INTRAMUSCULAR | Status: AC
  Administered 2016-07-07: 30 mg via INTRAMUSCULAR
  Filled 2016-07-07: qty 1

## 2016-07-07 NOTE — ED Provider Notes (Signed)
[facility]PT Provider Note   CSN: [id number] Arrival date & time: 07/07/16  1609     History   Chief Complaint Chief Complaint  Patient presents with  . Neck Pain    HPI [name] [name] is a 41 y.o. female.  Patient presents to the ED with a chief complaint of neck pain.  She states that she has had tension in her neck for the past 3 days.  She reports history of the same.  She has been treated with Robaxin with some relief.  She states that she carries her stress in her upper back and neck.  She denies any injury.  She denies fevers, chills, numbness, weakness, or tingling.  She denies any other associated symptoms.  Her symptoms are worsened with palpation and movement.   The history is provided by the patient. No language interpreter was used.    Past Medical History:  Diagnosis Date  . Anemia   . Arthritis     Patient Active Problem List   Diagnosis Date Noted  . Neck pain 03/15/2016  . Muscle strain 03/15/2016    Past Surgical History:  Procedure Laterality Date  . CESAREAN SECTION      OB History    No data available       Home Medications    Prior to Admission medications   Medication Sig Start Date End Date Taking? Authorizing Provider  albuterol (PROVENTIL) (2.5 MG/3ML) 0.083% nebulizer solution Take 3 mLs (2.5 mg total) by nebulization every 4 (four) hours as needed for wheezing. 12/24/13   Reuben Likes, MD  albuterol-ipratropium (COMBIVENT) 18-103 MCG/ACT inhaler Inhale 2 puffs into the lungs every 4 (four) hours as needed for wheezing or shortness of breath. 12/24/13   Reuben Likes, MD  cephALEXin (KEFLEX) 500 MG capsule Take 1 capsule (500 mg total) by mouth 4 (four) times daily. 10/17/14   Hannah Muthersbaugh, PA-C  cyclobenzaprine (FLEXERIL) 5 MG tablet Take 1 tablet (5 mg total) by mouth 2 (two) times daily as needed for muscle spasms. 07/02/14   Tiffany Neva Seat, PA-C  guaiFENesin-codeine (ROBITUSSIN AC) 100-10 MG/5ML syrup Take 5 mLs by mouth  3 (three) times daily as needed for cough. 12/24/13   Reuben Likes, MD  HYDROcodone-acetaminophen (NORCO) 5-325 MG tablet Take 1-2 tablets by mouth every 6 (six) hours as needed. 12/12/15   Geoffery Lyons, MD  ibuprofen (ADVIL,MOTRIN) 800 MG tablet Take 1 tablet (800 mg total) by mouth 3 (three) times daily. 08/20/13   Gilda Crease, MD  Ibuprofen-Diphenhydramine Cit (ADVIL PM PO) Take 0.5 tablets by mouth at bedtime as needed (pain).    Historical Provider, MD  ipratropium (ATROVENT) 0.06 % nasal spray Place 2 sprays into both nostrils 4 (four) times daily. 12/24/13   Reuben Likes, MD  meloxicam (MOBIC) 15 MG tablet Take 1 tablet (15 mg total) by mouth daily. 10/17/14   Hannah Muthersbaugh, PA-C  methocarbamol (ROBAXIN) 500 MG tablet Take 1 tablet (500 mg total) by mouth 2 (two) times daily. 07/07/16   Roxy Horseman, PA-C  naproxen (NAPROSYN) 500 MG tablet Take 1 tablet (500 mg total) by mouth 2 (two) times daily. 12/12/15   Geoffery Lyons, MD  predniSONE (DELTASONE) 10 MG tablet Take 2 tablets (20 mg total) by mouth daily. 07/02/14   Tiffany Neva Seat, PA-C  predniSONE (DELTASONE) 20 MG tablet Take 1 tablet (20 mg total) by mouth 2 (two) times daily. 12/24/13   Reuben Likes, MD    Family History No family  history on file.  Social History Social History  Substance Use Topics  . Smoking status: Current Every Day Smoker    Packs/day: 1.00    Types: Cigars  . Smokeless tobacco: Never Used  . Alcohol use No     Allergies   Patient has no known allergies.   Review of Systems Review of Systems  All other systems reviewed and are negative.    Physical Exam Updated Vital Signs BP (!) 151/88 (BP Location: Left Arm)   Pulse 79   Temp 98.9 F (37.2 C) (Oral)   Resp 20   SpO2 100%   Physical Exam Physical Exam  Constitutional: Pt appears well-developed and well-nourished. No distress.  HENT:  Head: Normocephalic and atraumatic.  Mouth/Throat: Oropharynx is clear and moist. No  oropharyngeal exudate.  Eyes: Conjunctivae are normal.  Neck: Normal range of motion. Neck supple.  No meningismus Cardiovascular: Normal rate, regular rhythm and intact distal pulses.   Pulmonary/Chest: Effort normal and breath sounds normal. No respiratory distress. Pt has no wheezes.  Abdominal: Pt exhibits no distension Musculoskeletal:  Cervical paraspinal muscles tender to palpation, no bony CTLS spine tenderness, deformity, step-off, or crepitus Lymphadenopathy: Pt has no cervical adenopathy.  Neurological: Pt is alert and oriented Speech is clear and goal oriented, follows commands Normal 5/5 strength in upper and lower extremities bilaterally including dorsiflexion and plantar flexion, strong and equal grip strength Sensation intact Moves extremities without ataxia, coordination intact Normal gait Normal balance Skin: Skin is warm and dry. No rash noted. Pt is not diaphoretic. No erythema.  Psychiatric: Pt has a normal mood and affect. Behavior is normal.  Nursing note and vitals reviewed.   ED Treatments / Results  Labs (all labs ordered are listed, but only abnormal results are displayed) Labs Reviewed - No data to display  EKG  EKG Interpretation None       Radiology No results found.  Procedures Procedures (including critical care time)  Medications Ordered in ED Medications  ketorolac (TORADOL) 30 MG/ML injection 30 mg (30 mg Intramuscular Given 07/07/16 1943)     Initial Impression / Assessment and Plan / ED Course  I have reviewed the triage vital signs and the nursing notes.  Pertinent labs & imaging results that were available during my care of the patient were reviewed by me and considered in my medical decision making (see chart for details).     Patient with neck pain. Seems to all be muscular. No rash.  No infection.  Hx of the same.  No neurological deficits and normal neuro exam.  Patient is ambulatory.  No loss of bowel or bladder control.   Doubt cauda equina.  Denies fever,  doubt epidural abscess or other lesion. Recommend back exercises, stretching, RICE, and will treat with a short course of robaxin.  Recommend sports medicine follow-up.   Final Clinical Impressions(s) / ED Diagnoses   Final diagnoses:  Muscle spasms of neck    New Prescriptions New Prescriptions   METHOCARBAMOL (ROBAXIN) 500 MG TABLET    Take 1 tablet (500 mg total) by mouth 2 (two) times daily.     Roxy Horseman, PA-C 07/07/16 1958    Jerelyn Scott, MD 07/07/16 2010

## 2016-07-07 NOTE — ED Triage Notes (Signed)
Patient complains of posterior neck pain x 3 days. States some otc meds help but pain worse with any ROM. Denies trauma

## 2016-08-30 ENCOUNTER — Encounter (HOSPITAL_COMMUNITY): Payer: Self-pay | Admitting: Nurse Practitioner

## 2016-08-30 ENCOUNTER — Emergency Department (HOSPITAL_COMMUNITY)
Admission: EM | Admit: 2016-08-30 | Discharge: 2016-08-30 | Discharge status: Against Medical Advice | Payer: Medicaid Other | Attending: Emergency Medicine | Admitting: Emergency Medicine

## 2016-08-30 DIAGNOSIS — R109 Unspecified abdominal pain: Secondary | ICD-10-CM | POA: Insufficient documentation

## 2016-08-30 DIAGNOSIS — M542 Cervicalgia: Secondary | ICD-10-CM | POA: Insufficient documentation

## 2016-08-30 LAB — COMPREHENSIVE METABOLIC PANEL
ALT: 11 U/L — ABNORMAL LOW (ref 14–54)
AST: 17 U/L (ref 15–41)
Albumin: 3.1 g/dL — ABNORMAL LOW (ref 3.5–5.0)
Alkaline Phosphatase: 58 U/L (ref 38–126)
Anion gap: 5 (ref 5–15)
BILIRUBIN TOTAL: 0.3 mg/dL (ref 0.3–1.2)
BUN: 8 mg/dL (ref 6–20)
CALCIUM: 8.6 mg/dL — AB (ref 8.9–10.3)
CO2: 25 mmol/L (ref 22–32)
Chloride: 108 mmol/L (ref 101–111)
Creatinine, Ser: 0.68 mg/dL (ref 0.44–1.00)
GFR calc Af Amer: >60 mL/min (ref >60)
Glucose, Bld: 94 mg/dL (ref 65–99)
POTASSIUM: 3.9 mmol/L (ref 3.5–5.1)
Sodium: 138 mmol/L (ref 135–145)
TOTAL PROTEIN: 6.3 g/dL — AB (ref 6.5–8.1)

## 2016-08-30 LAB — CBC
HCT: 27 % — ABNORMAL LOW (ref 36.0–46.0)
Hemoglobin: 7.8 g/dL — ABNORMAL LOW (ref 12.0–15.0)
MCH: 22.2 pg — ABNORMAL LOW (ref 26.0–34.0)
MCHC: 28.9 g/dL — ABNORMAL LOW (ref 30.0–36.0)
MCV: 76.9 fL — ABNORMAL LOW (ref 78.0–100.0)
PLATELETS: 371 10*3/uL (ref 150–400)
RBC: 3.51 MIL/uL — ABNORMAL LOW (ref 3.87–5.11)
RDW: 18.7 % — AB (ref 11.5–15.5)
WBC: 9 10*3/uL (ref 4.0–10.5)

## 2016-08-30 LAB — I-STAT BETA HCG BLOOD, ED (MC, WL, AP ONLY)

## 2016-08-30 NOTE — ED Triage Notes (Addendum)
Pt presents with c/o neck pain and abdominal pain.. The neck pain is chronic She describes the pain as a tight knot in her posterior neck.. She has been here for the pain several times and tried several treatments with no improvement. The abdominal pain began this week. She reports constipation. She is able to move her bowels with straining.  She denies fevers, nausea, vomiting, urinary pain or frequency, vaginal discharge or bleeding, diarrhea. She has been taking a stool softener with no improvement.

## 2016-08-30 NOTE — ED Notes (Signed)
Unable to locate pt in waiting room.

## 2016-08-31 ENCOUNTER — Emergency Department (HOSPITAL_COMMUNITY)
Admission: EM | Admit: 2016-08-31 | Discharge: 2016-08-31 | Disposition: A | Discharge status: Home / Self Care | Payer: Medicaid Other | Attending: Emergency Medicine | Admitting: Emergency Medicine

## 2016-08-31 ENCOUNTER — Encounter (HOSPITAL_COMMUNITY): Payer: Self-pay | Admitting: Emergency Medicine

## 2016-08-31 DIAGNOSIS — Z5321 Procedure and treatment not carried out due to patient leaving prior to being seen by health care provider: Secondary | ICD-10-CM | POA: Insufficient documentation

## 2016-08-31 DIAGNOSIS — R109 Unspecified abdominal pain: Secondary | ICD-10-CM | POA: Insufficient documentation

## 2016-08-31 NOTE — ED Notes (Signed)
Pt no longer in room x 20 min, left AMA. PA notified

## 2016-08-31 NOTE — ED Triage Notes (Signed)
Pt. Stated, [name] [name] had stomach pain for over 3 weeks and the neck pain is been going off and on for awhile. No other symptoms.  When I go to bathroom it fells hard.

## 2016-12-07 ENCOUNTER — Encounter (HOSPITAL_COMMUNITY): Payer: Self-pay

## 2016-12-07 ENCOUNTER — Emergency Department (HOSPITAL_COMMUNITY): Payer: Medicaid Other

## 2016-12-07 ENCOUNTER — Emergency Department (HOSPITAL_COMMUNITY)
Admission: EM | Admit: 2016-12-07 | Discharge: 2016-12-07 | Disposition: A | Discharge status: Home / Self Care | Payer: Medicaid Other | Attending: Emergency Medicine | Admitting: Emergency Medicine

## 2016-12-07 DIAGNOSIS — T148XXA Other injury of unspecified body region, initial encounter: Secondary | ICD-10-CM

## 2016-12-07 DIAGNOSIS — Z79899 Other long term (current) drug therapy: Secondary | ICD-10-CM | POA: Diagnosis not present

## 2016-12-07 DIAGNOSIS — F17290 Nicotine dependence, other tobacco product, uncomplicated: Secondary | ICD-10-CM | POA: Diagnosis not present

## 2016-12-07 DIAGNOSIS — M62838 Other muscle spasm: Secondary | ICD-10-CM | POA: Insufficient documentation

## 2016-12-07 DIAGNOSIS — R079 Chest pain, unspecified: Secondary | ICD-10-CM | POA: Diagnosis present

## 2016-12-07 DIAGNOSIS — R252 Cramp and spasm: Secondary | ICD-10-CM

## 2016-12-07 MED ORDER — NAPROXEN 500 MG PO TABS: 500.0000 mg | ORAL_TABLET | Freq: Two times a day (BID) | ORAL | 0 refills | Status: DC

## 2016-12-07 MED ORDER — CYCLOBENZAPRINE HCL 10 MG PO TABS: 10.0000 mg | ORAL_TABLET | Freq: Every evening | ORAL | 0 refills | Status: DC | PRN

## 2016-12-07 NOTE — ED Triage Notes (Signed)
Per Pt, Pt reports muscle cramping at her left rib cage when sneezing or having jerking movements. Reports it decreases with heating pack.

## 2016-12-07 NOTE — Discharge Instructions (Signed)
As discussed, The medicine prescribed can help with muscle spasm but cannot be taken if driving, with alcohol or operating machinery.   Continue with heat to help relieve spasm and take naproxen twice a day with food. Your chest xray was negative for acute abnormalities.  Follow up with your Primary care provider if symptoms persist beyond a week. Return to the emergency department if symptoms worsen, chest pressure, shortness of breath, nausea, vomiting, lightheadedness, or any other new concerning symptoms in the meantime.

## 2016-12-07 NOTE — ED Provider Notes (Signed)
[facility]-EMERGENCY DEPT Provider Note   CSN: [id number] Arrival date & time: 12/07/16  1519     History   Chief Complaint Chief Complaint  Patient presents with  . Chest Pain    HPI [name] [name] is a 41 y.o. female presenting with a sensation of a knot in her intercostals on the left side underneath her left breast for the past few months. She noted that he has become more painful after she sneezed this past Tuesday and has notice some tightness and spasm in that area. She has applied heat with relief. She denies any productive cough, hemoptysis, chest pressure, shortness of breath, fever, chills, nausea, vomiting, diarrhea or abdominal pain. Patient is otherwise healthy. 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  HPI  Past Medical History:  Diagnosis Date  . Anemia   . Arthritis     Patient Active Problem List   Diagnosis Date Noted  . Neck pain 03/15/2016  . Muscle strain 03/15/2016    Past Surgical History:  Procedure Laterality Date  . CESAREAN SECTION      OB History    No data available       Home Medications    Prior to Admission medications   Medication Sig Start Date End Date Taking? Authorizing Provider  albuterol (PROVENTIL) (2.5 MG/3ML) 0.083% nebulizer solution Take 3 mLs (2.5 mg total) by nebulization every 4 (four) hours as needed for wheezing. 12/24/13   Reuben Likes, MD  albuterol-ipratropium (COMBIVENT) 18-103 MCG/ACT inhaler Inhale 2 puffs into the lungs every 4 (four) hours as needed for wheezing or shortness of breath. 12/24/13   Reuben Likes, MD  cephALEXin (KEFLEX) 500 MG capsule Take 1 capsule (500 mg total) by mouth 4 (four) times daily. 10/17/14   Muthersbaugh, Dahlia Client, PA-C  cyclobenzaprine (FLEXERIL) 10 MG tablet Take 1 tablet (10 mg total) by mouth at bedtime as needed for muscle spasms. 12/07/16   Mathews Robinsons B, PA-C  guaiFENesin-codeine (ROBITUSSIN AC) 100-10 MG/5ML syrup  Take 5 mLs by mouth 3 (three) times daily as needed for cough. 12/24/13   Reuben Likes, MD  HYDROcodone-acetaminophen (NORCO) 5-325 MG tablet Take 1-2 tablets by mouth every 6 (six) hours as needed. 12/12/15   Geoffery Lyons, MD  ibuprofen (ADVIL,MOTRIN) 800 MG tablet Take 1 tablet (800 mg total) by mouth 3 (three) times daily. 08/20/13   Gilda Crease, MD  Ibuprofen-Diphenhydramine Cit (ADVIL PM PO) Take 0.5 tablets by mouth at bedtime as needed (pain).    [provider]  ipratropium (ATROVENT) 0.06 % nasal spray Place 2 sprays into both nostrils 4 (four) times daily. 12/24/13   Reuben Likes, MD  meloxicam (MOBIC) 15 MG tablet Take 1 tablet (15 mg total) by mouth daily. 10/17/14   Muthersbaugh, Dahlia Client, PA-C  methocarbamol (ROBAXIN) 500 MG tablet Take 1 tablet (500 mg total) by mouth 2 (two) times daily. 07/07/16   Roxy Horseman, PA-C  naproxen (NAPROSYN) 500 MG tablet Take 1 tablet (500 mg total) by mouth 2 (two) times daily with a meal. 12/07/16   Mathews Robinsons B, PA-C  predniSONE (DELTASONE) 10 MG tablet Take 2 tablets (20 mg total) by mouth daily. 07/02/14   Marlon Pel, PA-C  predniSONE (DELTASONE) 20 MG tablet Take 1 tablet (20 mg total) by mouth 2 (two) times daily. 12/24/13   Reuben Likes, MD    Family History History reviewed. No pertinent family history.  Social History Social History  Substance Use Topics  . Smoking status: Current Every Day Smoker    Packs/day: 1.00    Types: Cigars  . Smokeless tobacco: Never Used  . Alcohol use No     Allergies   Patient has no known allergies.   Review of Systems Review of Systems  Constitutional: Negative for chills, diaphoresis and fever.  HENT: Negative for congestion.   Respiratory: Negative for cough, choking, chest tightness, shortness of breath, wheezing and stridor.   Cardiovascular: Negative for chest pain and palpitations.  Gastrointestinal: Negative for abdominal distention, abdominal pain,  nausea and vomiting.  Musculoskeletal: Positive for myalgias. Negative for arthralgias, back pain, gait problem, joint swelling, neck pain and neck stiffness.  Skin: Negative for color change, pallor, rash and wound.  Neurological: Negative for dizziness, weakness, numbness and headaches.     Physical Exam Updated Vital Signs BP 107/61 (BP Location: Right Arm)   Pulse 77   Temp 98.4 F (36.9 C) (Oral)   Resp 18   Ht 5\' 1"  (1.549 m)   Wt 103.9 kg (229 lb)   LMP 11/27/2016   SpO2 99%   BMI 43.27 kg/m   Physical Exam  Constitutional: She appears well-developed and well-nourished. No distress.  Afebrile, nontoxic-appearing, sitting comfortably in bed in no acute distress.  HENT:  Head: Normocephalic and atraumatic.  Eyes: Conjunctivae and EOM are normal.  Neck: Normal range of motion.  Cardiovascular: Normal rate, regular rhythm and normal heart sounds.   No murmur heard. Pulmonary/Chest: Effort normal and breath sounds normal. No respiratory distress. She has no wheezes. She has no rales. She exhibits tenderness.  Left midclavicular lower intercostal tenderness to palpation  Abdominal: Soft. She exhibits no distension and no mass. There is no tenderness. There is no rebound and no guarding.  Musculoskeletal: Normal range of motion. She exhibits no edema.  Neurological: She is alert.  Skin: Skin is warm and dry. No rash noted. She is not diaphoretic. No erythema. No pallor.  Psychiatric: She has a normal mood and affect.  Nursing note and vitals reviewed.    ED Treatments / Results  Labs (all labs ordered are listed, but only abnormal results are displayed) Labs Reviewed - No data to display  EKG  EKG Interpretation None       Radiology No results found.  Procedures Procedures (including critical care time)  Medications Ordered in ED Medications - No data to display   Initial Impression / Assessment and Plan / ED Course  I have reviewed the triage vital  signs and the nursing notes.  Pertinent labs & imaging results that were available during my care of the patient were reviewed by me and considered in my medical decision making (see chart for details).    Patient presenting with left midclavicular lower intercostal spasm and tightness in one focal area. Reported improvement with heat. This has been ongoing for the last few months and was aggravated by sneezing forcefully.  I doubt any serious etiology to patient's symptoms. No significant past medical history. Chest x-ray negative  Will discgharge home with symptomatic relief and close follow-up with PCP in the next week if symptoms persist.  Discussed strict return precautions and advised to return to the emergency department if experiencing any new or worsening symptoms. Instructions were understood and patient agreed with discharge plan. Final Clinical Impressions(s) / ED Diagnoses   Final diagnoses:  Muscle strain  Spasm    New Prescriptions New Prescriptions   CYCLOBENZAPRINE (  FLEXERIL) 10 MG TABLET    Take 1 tablet (10 mg total) by mouth at bedtime as needed for muscle spasms.   NAPROXEN (NAPROSYN) 500 MG TABLET    Take 1 tablet (500 mg total) by mouth 2 (two) times daily with a meal.     Gregary Cromer 12/07/16 1917    Marily Memos, MD 12/07/16 2041

## 2017-01-18 ENCOUNTER — Emergency Department (HOSPITAL_COMMUNITY): Payer: Medicaid Other

## 2017-01-18 ENCOUNTER — Encounter (HOSPITAL_COMMUNITY): Payer: Self-pay | Admitting: *Deleted

## 2017-01-18 ENCOUNTER — Emergency Department (HOSPITAL_COMMUNITY)
Admission: EM | Admit: 2017-01-18 | Discharge: 2017-01-18 | Disposition: A | Discharge status: Home / Self Care | Payer: Medicaid Other | Attending: Emergency Medicine | Admitting: Emergency Medicine

## 2017-01-18 DIAGNOSIS — F17290 Nicotine dependence, other tobacco product, uncomplicated: Secondary | ICD-10-CM | POA: Diagnosis not present

## 2017-01-18 DIAGNOSIS — M542 Cervicalgia: Secondary | ICD-10-CM

## 2017-01-18 MED ORDER — CYCLOBENZAPRINE HCL 10 MG PO TABS: 10.0000 mg | ORAL_TABLET | Freq: Every evening | ORAL | 0 refills | Status: DC | PRN

## 2017-01-18 MED ORDER — IBUPROFEN 400 MG PO TABS
600.0000 mg | ORAL_TABLET | Freq: Once | ORAL | Status: AC
  Administered 2017-01-18: 600 mg via ORAL
  Filled 2017-01-18: qty 1

## 2017-01-18 NOTE — ED Provider Notes (Signed)
[facility] Provider Note   CSN: [id number] Arrival date & time: 01/18/17  1821     History   Chief Complaint Chief Complaint  Patient presents with  . Back Pain    HPI [name] [name] is a 41 y.o. female with a history of arthritis who presents to emergency department today for neck pain 2 days. The patient states that over the last 2 days she has had tension in her neck that is worse with palpation, flexion, rotation and bending of the next. She has been taking Tylenol for this with no relief. She states she has some relief with warm water bottle to the neck. She says she has been seen here in the past for this and told that she has some arthritis. Review of images from 12/12/2015 show mild degenerative changes of the cervical spine.the patient denies any fever, chills, numbness/weakness/tingling of the upper or lower extremities. She denies any bowel or bladder incontinence or saddle anesthesia.   HPI  Past Medical History:  Diagnosis Date  . Anemia   . Arthritis     Patient Active Problem List   Diagnosis Date Noted  . Neck pain 03/15/2016  . Muscle strain 03/15/2016    Past Surgical History:  Procedure Laterality Date  . CESAREAN SECTION      OB History    No data available       Home Medications    Prior to Admission medications   Medication Sig Start Date End Date Taking? Authorizing Provider  acetaminophen (TYLENOL) 500 MG tablet Take 500 mg by mouth every 6 (six) hours as needed.   Yes [provider]  albuterol (PROVENTIL) (2.5 MG/3ML) 0.083% nebulizer solution Take 3 mLs (2.5 mg total) by nebulization every 4 (four) hours as needed for wheezing. Patient not taking: Reported on 01/18/2017 12/24/13   Reuben LikesKeller, David C, MD  albuterol-ipratropium [facility](COMBIVENT) [phone] MCG/ACT inhaler Inhale 2 puffs into the lungs every 4 (four) hours as needed for wheezing or shortness of breath. Patient not taking: Reported on  01/18/2017 12/24/13   Reuben LikesKeller, David C, MD  cephALEXin (KEFLEX) 500 MG capsule Take 1 capsule (500 mg total) by mouth 4 (four) times daily. Patient not taking: Reported on 01/18/2017 10/17/14   Muthersbaugh, Dahlia ClientHannah, PA-C  cyclobenzaprine (FLEXERIL) 10 MG tablet Take 1 tablet (10 mg total) by mouth at bedtime as needed for muscle spasms. Patient not taking: Reported on 01/18/2017 12/07/16   Georgiana ShoreMitchell, Jessica B, PA-C  guaiFENesin-codeine [facility](ROBITUSSIN AC) 100-10 MG/5ML syrup Take 5 mLs by mouth 3 (three) times daily as needed for cough. Patient not taking: Reported on 01/18/2017 12/24/13   Reuben LikesKeller, David C, MD  HYDROcodone-acetaminophen [facility](NORCO) 5-325 MG tablet Take 1-2 tablets by mouth every 6 (six) hours as needed. Patient not taking: Reported on 01/18/2017 12/12/15   Geoffery Lyonselo, Douglas, MD  ibuprofen (ADVIL,MOTRIN) 800 MG tablet Take 1 tablet (800 mg total) by mouth 3 (three) times daily. Patient not taking: Reported on 01/18/2017 08/20/13   Gilda CreasePollina, Christopher J, MD  ipratropium (ATROVENT) 0.06 % nasal spray Place 2 sprays into both nostrils 4 (four) times daily. Patient not taking: Reported on 01/18/2017 12/24/13   Reuben LikesKeller, David C, MD  meloxicam (MOBIC) 15 MG tablet Take 1 tablet (15 mg total) by mouth daily. Patient not taking: Reported on 01/18/2017 10/17/14   Muthersbaugh, Dahlia ClientHannah, PA-C  methocarbamol (ROBAXIN) 500 MG tablet Take 1 tablet (500 mg total) by mouth 2 (two) times daily. Patient not taking: Reported on 01/18/2017 07/07/16  Roxy Horseman, PA-C  naproxen (NAPROSYN) 500 MG tablet Take 1 tablet (500 mg total) by mouth 2 (two) times daily with a meal. Patient not taking: Reported on 01/18/2017 12/07/16   Georgiana Shore, PA-C  predniSONE (DELTASONE) 10 MG tablet Take 2 tablets (20 mg total) by mouth daily. Patient not taking: Reported on 01/18/2017 07/02/14   Marlon Pel, PA-C  predniSONE (DELTASONE) 20 MG tablet Take 1 tablet (20 mg total) by mouth 2 (two) times daily. Patient not taking:  Reported on 01/18/2017 12/24/13   Reuben Likes, MD    Family History History reviewed. No pertinent family history.  Social History Social History  Substance Use Topics  . Smoking status: Current Every Day Smoker    Packs/day: 1.00    Types: Cigars  . Smokeless tobacco: Never Used  . Alcohol use No     Allergies   Patient has no known allergies.   Review of Systems Review of Systems  Skin: Negative for wound.  All other systems reviewed and are negative.    Physical Exam Updated Vital Signs BP 129/84 (BP Location: Right Arm)   Pulse 70   Temp 98.7 F (37.1 C) (Oral)   Resp 18   LMP 01/03/2017   SpO2 100%   Physical Exam  Constitutional: She appears well-developed and well-nourished. No distress.  Non-toxic appearing  HENT:  Head: Normocephalic and atraumatic.  Right Ear: External ear normal.  Left Ear: External ear normal.  Eyes: Conjunctivae are normal. Right eye exhibits no discharge. Left eye exhibits no discharge. No scleral icterus.  Neck: Normal range of motion. Neck supple. No spinous process tenderness present. No neck rigidity. Normal range of motion present.  Cardiovascular: Normal rate, regular rhythm, normal heart sounds and intact distal pulses.   No murmur heard. Pulses:      Radial pulses are 2+ on the right side, and 2+ on the left side.       Dorsalis pedis pulses are 2+ on the right side, and 2+ on the left side.       Posterior tibial pulses are 2+ on the right side, and 2+ on the left side.  Pulmonary/Chest: Effort normal and breath sounds normal. No respiratory distress.  Musculoskeletal:  Posterior and appearance appears normal. No evidence of obvious scoliosis or kyphosis. No obvious signs of skin changes, trauma, deformity, infection. Mild cervical spinous TTP. No step-offs. No T, or L spine tenderness or step-offs to palpation. Cervical paraspinal TTP on right side. No T, or L paraspinal tenderness. Lung expansion normal. Bilateral  upper and lower extremity strength 5 out of 5. Deep tendon reflex 2+ and equal bilaterally. Sensation of upper and lower extremities grossly intact.  Gait able. Radial,  PT and DP 2+ b/l. Cap refill <2 seconds.   Neurological: She is alert.  Mental Status:  Alert, oriented, thought content appropriate, able to give a coherent history. Speech fluent without evidence of aphasia. Able to follow 2 step commands without difficulty.  Cranial Nerves:  II:  Peripheral visual fields grossly normal, pupils equal, round, reactive to light III,IV, VI: ptosis not present, extra-ocular motions intact bilaterally  V,VII: smile symmetric, eyebrows raise symmetric, facial light touch sensation equal VIII: hearing grossly normal to voice  X: uvula elevates symmetrically  XI: bilateral shoulder shrug symmetric and strong XII: midline tongue extension without fassiculations Motor:  Normal tone. 5/5 in upper and lower extremities bilaterally including strong and equal grip strength and dorsiflexion/plantar flexion Sensory: Sensation intact to light touch  in all extremities. Negative Romberg.  Deep Tendon Reflexes: 2+ and symmetric in the biceps and patella Cerebellar: normal finger-to-nose with bilateral upper extremities. Normal heel-to -shin balance bilaterally of the lower extremity. No pronator drift.  Gait: normal gait and balance CV: distal pulses palpable throughout   Skin: Skin is warm, dry and intact. Capillary refill takes less than 2 seconds. No rash noted. She is not diaphoretic. No erythema. No pallor.  Psychiatric: She has a normal mood and affect.  Nursing note and vitals reviewed.    ED Treatments / Results  Labs (all labs ordered are listed, but only abnormal results are displayed) Labs Reviewed - No data to display  EKG  EKG Interpretation None       Radiology Dg Cervical Spine Complete  Result Date: 01/18/2017 CLINICAL DATA:  Neck pain 2 days.  No injury EXAM: CERVICAL SPINE -  COMPLETE 4+ VIEW COMPARISON:  12/12/2015 FINDINGS: Normal alignment. Negative for fracture. Disc degeneration and spurring on the right at C5-6 and C6-7 with moderate foraminal stenosis. This is unchanged from the prior study. Mild left foraminal narrowing at C4-5 and C5-6 and C6-7. Prevertebral soft tissues normal. IMPRESSION: Disc degeneration and spurring right greater than left. No acute abnormality. Electronically Signed   By: Marlan Palau M.D.   On: 01/18/2017 21:40    Procedures Procedures (including critical care time)  Medications Ordered in ED Medications - No data to display   Initial Impression / Assessment and Plan / ED Course  I have reviewed the triage vital signs and the nursing notes.  Pertinent labs & imaging results that were available during my care of the patient were reviewed by me and considered in my medical decision making (see chart for details).     Patient with neck pain. No step-offs on exam. No neurological deficits and normal neuro exam. No rash or evidence of infection on exam. Vital signs are reassuring. Patient has history of same but is requesting x-ray. Xray with known disc degeneration and spurring on right greater than left. Patient ambulatory.  No loss of bowel or bladder control.  No concern for cauda equina.  Do not suspect epidural abscess or other lesion. Recommend back exercises, stretching, Rice therapy and will treat with muscle relaxers and Tylenol. Recommend PCP versus with a follow-up. Strict return cautioned given. Patient agreement with plan.  Final Clinical Impressions(s) / ED Diagnoses   Final diagnoses:  Neck pain    New Prescriptions Current Discharge Medication List       Princella Pellegrini 01/18/17 2212    Nira Conn, MD 01/19/17 [phone]

## 2017-01-18 NOTE — ED Triage Notes (Signed)
Pt reports pain to back of neck that radiates down her spine and has sharp pains to her back. Reports being seen for same in past and was told its possibly arthritis. Is ambulatory at triage, no acute distress is noted.

## 2017-01-18 NOTE — Discharge Instructions (Signed)
Your x-ray showed evidence of arthritis. I'm prescribing you a muscle relaxer to take at nighttime. Please take Tylenol 650mg  three times a day for pain. It is important that you establish care with a primary care provider. I will provide you a referral to the wellness center that sees patients without insurance. He can also use the number on the following she is provided order to find a primary care provider or use your Medicaid card in order to find a primary care provider. I'm also providing you with a referral to orthopedics. If you develop worsening or new concerning symptoms you can return to the emergency department for re-evaluation.

## 2017-01-29 ENCOUNTER — Ambulatory Visit (INDEPENDENT_AMBULATORY_CARE_PROVIDER_SITE_OTHER): Payer: Self-pay | Admitting: Orthopedic Surgery

## 2017-04-08 ENCOUNTER — Encounter (HOSPITAL_COMMUNITY): Payer: Self-pay | Admitting: Emergency Medicine

## 2017-04-08 ENCOUNTER — Ambulatory Visit (HOSPITAL_COMMUNITY)
Admission: EM | Admit: 2017-04-08 | Discharge: 2017-04-08 | Disposition: A | Discharge status: Home / Self Care | Payer: Medicaid Other | Attending: Internal Medicine | Admitting: Internal Medicine

## 2017-04-08 DIAGNOSIS — M62838 Other muscle spasm: Secondary | ICD-10-CM

## 2017-04-08 MED ORDER — PREDNISONE 10 MG PO TABS: 10.0000 mg | ORAL_TABLET | Freq: Every day | ORAL | 0 refills | Status: DC

## 2017-04-08 MED ORDER — CYCLOBENZAPRINE HCL 5 MG PO TABS: 5.0000 mg | ORAL_TABLET | Freq: Three times a day (TID) | ORAL | 0 refills | Status: DC | PRN

## 2017-04-08 NOTE — ED Provider Notes (Signed)
[facility]    CSN: [id number] Arrival date & time: 04/08/17  1713     History   Chief Complaint Chief Complaint  Patient presents with  . Torticollis    HPI [name] [name] is a 42 y.o. female presents to the urgent care facility for evaluation of right-sided neck pain.  She describes tightness and spasms that is increased over the last week.  She denies any trauma or injury.  No numbness tingling or weakness in right upper extremity.  She denies any fevers, chest pain, shortness of breath.  Patient has taken Tylenol with no improvement.  Pain is 6 out of 10.  She states she has had x-rays in the past showing arthritis of her cervical spine.  HPI  Past Medical History:  Diagnosis Date  . Anemia   . Arthritis     Patient Active Problem List   Diagnosis Date Noted  . Neck pain 03/15/2016  . Muscle strain 03/15/2016    Past Surgical History:  Procedure Laterality Date  . CESAREAN SECTION      OB History    No data available       Home Medications    Prior to Admission medications   Medication Sig Start Date End Date Taking? Authorizing Provider  acetaminophen (TYLENOL) 500 MG tablet Take 500 mg by mouth every 6 (six) hours as needed.   Yes [provider]  albuterol (PROVENTIL) (2.5 MG/3ML) 0.083% nebulizer solution Take 3 mLs (2.5 mg total) by nebulization every 4 (four) hours as needed for wheezing. Patient not taking: Reported on 01/18/2017 12/24/13   Reuben Likes, MD  albuterol-ipratropium [facility]) [phone] MCG/ACT inhaler Inhale 2 puffs into the lungs every 4 (four) hours as needed for wheezing or shortness of breath. Patient not taking: Reported on 01/18/2017 12/24/13   Reuben Likes, MD  cephALEXin (KEFLEX) 500 MG capsule Take 1 capsule (500 mg total) by mouth 4 (four) times daily. Patient not taking: Reported on 01/18/2017 10/17/14   Muthersbaugh, Dahlia Client, PA-C  cyclobenzaprine (FLEXERIL) 5 MG tablet Take 1-2 tablets (5-10 mg total) by  mouth 3 (three) times daily as needed for muscle spasms. 04/08/17   Evon Slack, PA-C  guaiFENesin-codeine (ROBITUSSIN AC) 100-10 MG/5ML syrup Take 5 mLs by mouth 3 (three) times daily as needed for cough. Patient not taking: Reported on 01/18/2017 12/24/13   Reuben Likes, MD  HYDROcodone-acetaminophen [facility]) 5-325 MG tablet Take 1-2 tablets by mouth every 6 (six) hours as needed. Patient not taking: Reported on 01/18/2017 12/12/15   Geoffery Lyons, MD  ibuprofen (ADVIL,MOTRIN) 800 MG tablet Take 1 tablet (800 mg total) by mouth 3 (three) times daily. 08/20/13   Gilda Crease, MD  ipratropium (ATROVENT) 0.06 % nasal spray Place 2 sprays into both nostrils 4 (four) times daily. Patient not taking: Reported on 01/18/2017 12/24/13   Reuben Likes, MD  meloxicam (MOBIC) 15 MG tablet Take 1 tablet (15 mg total) by mouth daily. Patient not taking: Reported on 01/18/2017 10/17/14   Muthersbaugh, Dahlia Client, PA-C  methocarbamol (ROBAXIN) 500 MG tablet Take 1 tablet (500 mg total) by mouth 2 (two) times daily. Patient not taking: Reported on 01/18/2017 07/07/16   Roxy Horseman, PA-C  naproxen (NAPROSYN) 500 MG tablet Take 1 tablet (500 mg total) by mouth 2 (two) times daily with a meal. Patient not taking: Reported on 01/18/2017 12/07/16   Georgiana Shore, PA-C  predniSONE (DELTASONE) 10 MG tablet Take 1 tablet (10 mg total) by mouth daily.  6,5,4,3,2,1 six day taper 04/08/17   Evon Slack, PA-C    Family History No family history on file.  Social History Social History   Tobacco Use  . Smoking status: Current Every Day Smoker    Packs/day: 0.20    Types: Cigars  . Smokeless tobacco: Never Used  Substance Use Topics  . Alcohol use: No  . Drug use: No     Allergies   Patient has no known allergies.   Review of Systems Review of Systems  Constitutional: Negative for chills and fever.  Respiratory: Negative for shortness of breath.   Cardiovascular: Negative for chest pain.   Gastrointestinal: Negative for abdominal pain, nausea and vomiting.  Genitourinary: Negative for difficulty urinating, dysuria and urgency.  Musculoskeletal: Positive for neck pain and neck stiffness. Negative for back pain and myalgias.  Skin: Negative for rash.  Neurological: Negative for dizziness, numbness and headaches.     Physical Exam Triage Vital Signs ED Triage Vitals  Enc Vitals Group     BP 04/08/17 1738 (!) 142/86     Pulse Rate 04/08/17 1738 75     Resp 04/08/17 1738 16     Temp 04/08/17 1738 98.6 F (37 C)     Temp Source 04/08/17 1738 Oral     SpO2 04/08/17 1738 100 %     Weight 04/08/17 1740 220 lb (99.8 kg)     Height 04/08/17 1740 5\' 1"  (1.549 m)     Head Circumference --      Peak Flow --      Pain Score 04/08/17 1741 8     Pain Loc --      Pain Edu? --      Excl. in GC? --    No data found.  Updated Vital Signs BP (!) 142/86   Pulse 75   Temp 98.6 F (37 C) (Oral)   Resp 16   Ht 5\' 1"  (1.549 m)   Wt 220 lb (99.8 kg)   LMP 03/22/2017   SpO2 100%   BMI 41.57 kg/m   Visual Acuity Right Eye Distance:   Left Eye Distance:   Bilateral Distance:    Right Eye Near:   Left Eye Near:    Bilateral Near:     Physical Exam  Constitutional: She is oriented to person, place, and time. She appears well-developed and well-nourished.  HENT:  Head: Normocephalic and atraumatic.  Eyes: Conjunctivae are normal.  Neck: Normal range of motion.  Cardiovascular: Normal rate.  Pulmonary/Chest: Effort normal. No respiratory distress.  Musculoskeletal:  Cervical Spine: Examination of the cervical spine reveals no bony abnormality, no edema, and no ecchymosis.  There is no step-off.  The patient has full active and passive range of motion of the cervical spine with flexion, extension, and right and left bend with rotation.  There is no crepitus with range of motion exercises.  The patient is non-tender along the spinous process to palpation.  The patient has  mild right paravertebral muscle tenderness with noted spasm.  There is no parascapular discomfort.  The patient has a negative axial compression test.  The patient has a negative Spurling test.  The patient has a negative overhead arm test for thoracic outlet syndrome.    Right and left upper Extremity: Examination of the right and left shoulder and arm showed no bony abnormality or edema.  The patient has normal active and passive motion with abduction, flexion, internal rotation, and external rotation.  The patient has no  tenderness with motion.  The patient has a negative Hawkins test and a negative impingement test.  The patient has a negative drop arm test.  The patient is non-tender along the deltoid muscle.  There is no subacromial space tenderness with no AC joint tenderness.  The patient has no instability of the shoulder with anterior-posterior motion.  There is a negative sulcus sign.  The rotator cuff muscle strength is 5/5 with supraspinatus, 5/5 with internal rotation, and 5/5 with external rotation.    Neurological: She is alert and oriented to person, place, and time.  Skin: Skin is warm. No rash noted.  Psychiatric: She has a normal mood and affect. Her behavior is normal. Thought content normal.     UC Treatments / Results  Labs (all labs ordered are listed, but only abnormal results are displayed) Labs Reviewed - No data to display  EKG  EKG Interpretation None       Radiology No results found.  Procedures Procedures (including critical care time)  Medications Ordered in UC Medications - No data to display   Initial Impression / Assessment and Plan / UC Course  I have reviewed the triage vital signs and the nursing notes.  Pertinent labs & imaging results that were available during my care of the patient were reviewed by me and considered in my medical decision making (see chart for details).     42 year old female with nontraumatic right-sided neck pain.   Signs and symptoms consistent with cervical muscle spasm.  She is given prescription for prednisone and Flexeril.  States she is unable to tolerate NSAIDs and has had no improvement with Tylenol.  Patient is given information on following up with orthopedics if no improvement.  Final Clinical Impressions(s) / UC Diagnoses   Final diagnoses:  Cervical paraspinal muscle spasm    ED Discharge Orders        Ordered    predniSONE (DELTASONE) 10 MG tablet  Daily     04/08/17 1825    cyclobenzaprine (FLEXERIL) 5 MG tablet  3 times daily PRN     04/08/17 1825        Evon SlackGaines, Thomas C, PA-C 04/08/17 1832

## 2017-04-08 NOTE — ED Triage Notes (Signed)
PT reports she has been told she has arthritis in her neck. PT reports she feels as though her neck is swollen and she has right sided neck and back pain. She woke up with this.  PT also reports chest "Aching". PT states, " I'm supposed to have bronchitis." PT then states she has not been treated for bronchitis in over a year and does not have a cough. PT is not a good historian.

## 2017-04-08 NOTE — Discharge Instructions (Signed)
Please take medications as prescribed.  After finishing prednisone you may take Tylenol and/or ibuprofen as needed for pain.  Follow-up with orthopedist if no improvement in 5-7 days.

## 2017-07-05 ENCOUNTER — Other Ambulatory Visit: Payer: Self-pay

## 2017-07-05 ENCOUNTER — Emergency Department (HOSPITAL_COMMUNITY): Payer: Self-pay

## 2017-07-05 ENCOUNTER — Emergency Department (HOSPITAL_COMMUNITY)
Admission: EM | Admit: 2017-07-05 | Discharge: 2017-07-05 | Disposition: A | Discharge status: Home / Self Care | Payer: Self-pay | Attending: Emergency Medicine | Admitting: Emergency Medicine

## 2017-07-05 ENCOUNTER — Encounter (HOSPITAL_COMMUNITY): Payer: Self-pay | Admitting: Emergency Medicine

## 2017-07-05 DIAGNOSIS — R519 Headache, unspecified: Secondary | ICD-10-CM

## 2017-07-05 DIAGNOSIS — G43809 Other migraine, not intractable, without status migrainosus: Secondary | ICD-10-CM

## 2017-07-05 DIAGNOSIS — R51 Headache: Secondary | ICD-10-CM | POA: Insufficient documentation

## 2017-07-05 MED ORDER — IBUPROFEN 400 MG PO TABS
400.0000 mg | ORAL_TABLET | Freq: Once | ORAL | Status: AC | PRN
  Administered 2017-07-05: 400 mg via ORAL
  Filled 2017-07-05: qty 1

## 2017-07-05 MED ORDER — DIPHENHYDRAMINE HCL 50 MG/ML IJ SOLN
25.0000 mg | Freq: Once | INTRAMUSCULAR | Status: AC
  Administered 2017-07-05: 25 mg via INTRAVENOUS
  Filled 2017-07-05: qty 1

## 2017-07-05 MED ORDER — SODIUM CHLORIDE 0.9 % IV BOLUS
1000.0000 mL | Freq: Once | INTRAVENOUS | Status: AC
Start: 2017-07-05 — End: 2017-07-05
  Administered 2017-07-05: 1000 mL via INTRAVENOUS

## 2017-07-05 MED ORDER — METOCLOPRAMIDE HCL 5 MG/ML IJ SOLN
10.0000 mg | Freq: Once | INTRAMUSCULAR | Status: AC
  Administered 2017-07-05: 10 mg via INTRAVENOUS
  Filled 2017-07-05: qty 2

## 2017-07-05 MED ORDER — ONDANSETRON 4 MG PO TBDP
4.0000 mg | ORAL_TABLET | Freq: Once | ORAL | Status: AC
  Administered 2017-07-05: 4 mg via ORAL
  Filled 2017-07-05: qty 1

## 2017-07-05 MED ORDER — KETOROLAC TROMETHAMINE 30 MG/ML IJ SOLN
30.0000 mg | Freq: Once | INTRAMUSCULAR | Status: AC
  Administered 2017-07-05: 30 mg via INTRAVENOUS
  Filled 2017-07-05: qty 1

## 2017-07-05 NOTE — ED Provider Notes (Signed)
[facility] EMERGENCY DEPARTMENT Provider Note   CSN: [id number] Arrival date & time: 07/05/17  2020     History   Chief Complaint Chief Complaint  Patient presents with  . Headache    HPI [name] [name] is a 42 y.o. female.  The history is provided by the patient. No language interpreter was used.  Headache   This is a new problem. The problem occurs constantly. The problem has not changed since onset.The headache is associated with an unknown factor. The pain is located in the occipital region. The pain is moderate. The pain does not radiate. Associated symptoms include nausea. Pertinent negatives include no near-syncope. She has tried nothing for the symptoms. The treatment provided no relief.   Pt complains of a headache.  Pt reports no relief from normal medications.  Pt has been told that she needed a ct scan in the past but she has not had.   Past Medical History:  Diagnosis Date  . Anemia   . Arthritis     Patient Active Problem List   Diagnosis Date Noted  . Neck pain 03/15/2016  . Muscle strain 03/15/2016    Past Surgical History:  Procedure Laterality Date  . CESAREAN SECTION       OB History   None      Home Medications    Prior to Admission medications   Medication Sig Start Date End Date Taking? Authorizing Provider  aspirin-acetaminophen-caffeine (EXCEDRIN MIGRAINE) [phone] MG tablet Take 1 tablet by mouth every 6 (six) hours as needed for headache.   Yes [provider]  cyclobenzaprine (FLEXERIL) 5 MG tablet Take 1-2 tablets (5-10 mg total) by mouth 3 (three) times daily as needed for muscle spasms. Patient not taking: Reported on 07/05/2017 04/08/17   Evon Slack, PA-C    Family History No family history on file.  Social History Social History   Tobacco Use  . Smoking status: Current Every Day Smoker    Packs/day: 0.20    Types: Cigars  . Smokeless tobacco: Never Used  Substance Use Topics  . Alcohol  use: No  . Drug use: No     Allergies   Patient has no known allergies.   Review of Systems Review of Systems  Cardiovascular: Negative for near-syncope.  Gastrointestinal: Positive for nausea.  Neurological: Positive for headaches.  All other systems reviewed and are negative.    Physical Exam Updated Vital Signs BP 130/67 (BP Location: Right Arm)   Pulse 63   Temp 98.9 F (37.2 C) (Oral)   Resp 16   Ht 5' (1.524 m)   Wt 99.8 kg (220 lb)   SpO2 100%   BMI 42.97 kg/m   Physical Exam  Constitutional: She is oriented to person, place, and time. She appears well-developed and well-nourished.  HENT:  Head: Normocephalic.  Mouth/Throat: Oropharynx is clear and moist.  Eyes: Pupils are equal, round, and reactive to light. EOM are normal.  Neck: Normal range of motion.  Cardiovascular: Normal rate and normal heart sounds.  Pulmonary/Chest: Effort normal.  Abdominal: Soft. She exhibits no distension.  Musculoskeletal: Normal range of motion.  Neurological: She is alert and oriented to person, place, and time.  Skin: Skin is warm.  Psychiatric: She has a normal mood and affect.  Nursing note and vitals reviewed.    ED Treatments / Results  Labs (all labs ordered are listed, but only abnormal results are displayed) Labs Reviewed - No data to display  EKG None  Radiology Ct Head Wo Contrast  Result Date: 07/05/2017 CLINICAL DATA:  Recurrent severe headache. EXAM: CT HEAD WITHOUT CONTRAST TECHNIQUE: Contiguous axial images were obtained from the base of the skull through the vertex without intravenous contrast. COMPARISON:  None. FINDINGS: Brain: The brain shows a normal appearance without evidence of malformation, atrophy, old or acute small or large vessel infarction, mass lesion, hemorrhage, hydrocephalus or extra-axial collection. Slight asymmetry of the lateral ventricles is a normal variant. Vascular: No hyperdense vessel. No evidence of atherosclerotic  calcification. Skull: Normal.  No traumatic finding.  No focal bone lesion. Sinuses/Orbits: Sinuses are clear. Orbits appear normal. Mastoids are clear. Other: None significant IMPRESSION: Normal head CT Electronically Signed   By: Paulina FusiMark  Shogry M.D.   On: 07/05/2017 22:00    Procedures Procedures (including critical care time)  Medications Ordered in ED Medications  sodium chloride 0.9 % bolus 1,000 mL (1,000 mLs Intravenous New Bag/Given 07/05/17 2215)  ibuprofen (ADVIL,MOTRIN) tablet 400 mg (400 mg Oral Given 07/05/17 2043)  ondansetron (ZOFRAN-ODT) disintegrating tablet 4 mg (4 mg Oral Given 07/05/17 2043)  metoCLOPramide (REGLAN) injection 10 mg (10 mg Intravenous Given 07/05/17 2215)  ketorolac (TORADOL) 30 MG/ML injection 30 mg (30 mg Intravenous Given 07/05/17 2215)  diphenhydrAMINE (BENADRYL) injection 25 mg (25 mg Intravenous Given 07/05/17 2215)     Initial Impression / Assessment and Plan / ED Course  I have reviewed the triage vital signs and the nursing notes.  Pertinent labs & imaging results that were available during my care of the patient were reviewed by me and considered in my medical decision making (see chart for details).     MDM  Pt given migraine cocktail.  Ct scan reviewed and is normal    Final Clinical Impressions(s) / ED Diagnoses   Final diagnoses:  Acute nonintractable headache, unspecified headache type    ED Discharge Orders    None    An After Visit Summary was printed and given to the patient.    Elson AreasSofia, Leslie K, [place] JerseyPA-C 07/06/17 0044    Terrilee FilesButler, Michael C, MD 07/07/17 [id number]

## 2017-07-05 NOTE — Discharge Instructions (Signed)
Return if any problems.
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
## 2017-10-08 ENCOUNTER — Encounter (HOSPITAL_COMMUNITY): Payer: Self-pay | Admitting: *Deleted

## 2017-10-08 ENCOUNTER — Emergency Department (HOSPITAL_COMMUNITY): Payer: Self-pay

## 2017-10-08 ENCOUNTER — Emergency Department (HOSPITAL_COMMUNITY)
Admission: EM | Admit: 2017-10-08 | Discharge: 2017-10-08 | Disposition: A | Discharge status: Home / Self Care | Payer: Self-pay | Attending: Emergency Medicine | Admitting: Emergency Medicine

## 2017-10-08 DIAGNOSIS — Y929 Unspecified place or not applicable: Secondary | ICD-10-CM | POA: Insufficient documentation

## 2017-10-08 DIAGNOSIS — Y999 Unspecified external cause status: Secondary | ICD-10-CM | POA: Insufficient documentation

## 2017-10-08 DIAGNOSIS — X58XXXA Exposure to other specified factors, initial encounter: Secondary | ICD-10-CM | POA: Insufficient documentation

## 2017-10-08 DIAGNOSIS — M25511 Pain in right shoulder: Secondary | ICD-10-CM | POA: Insufficient documentation

## 2017-10-08 DIAGNOSIS — Y9389 Activity, other specified: Secondary | ICD-10-CM | POA: Insufficient documentation

## 2017-10-08 DIAGNOSIS — S62656A Nondisplaced fracture of medial phalanx of right little finger, initial encounter for closed fracture: Secondary | ICD-10-CM | POA: Insufficient documentation

## 2017-10-08 DIAGNOSIS — F17290 Nicotine dependence, other tobacco product, uncomplicated: Secondary | ICD-10-CM | POA: Insufficient documentation

## 2017-10-08 MED ORDER — ACETAMINOPHEN 325 MG PO TABS: 325.0000 mg | ORAL_TABLET | Freq: Four times a day (QID) | ORAL | 0 refills | Status: DC | PRN

## 2017-10-08 MED ORDER — IBUPROFEN 400 MG PO TABS
600.0000 mg | ORAL_TABLET | Freq: Once | ORAL | Status: AC
  Administered 2017-10-08: 18:00:00 600 mg via ORAL
  Filled 2017-10-08: qty 1

## 2017-10-08 MED ORDER — METHOCARBAMOL 500 MG PO TABS: 500.0000 mg | ORAL_TABLET | Freq: Two times a day (BID) | ORAL | 0 refills | Status: DC

## 2017-10-08 NOTE — Progress Notes (Signed)
Orthopedic Tech Progress Note Patient Details:  [name] LincolnRonnie [name] 02/02/1976 [id number]  Ortho Devices Type of Ortho Device: Finger splint Ortho Device/Splint Location: rue 5th finger Ortho Device/Splint Interventions: Ordered, Application, Adjustment   Post Interventions Patient Tolerated: Well Instructions Provided: Care of device, Adjustment of device   Trinna PostMartinez, Leane Loring J 10/08/2017, 7:25 PM

## 2017-10-08 NOTE — Discharge Instructions (Addendum)
Please follow-up with [facility] and establish primary care provider with them or follow-up with your primary care provider regarding your visit today. You may follow-up with Dr. Ronie SpiesWeingold's office, [facility], regarding your finger fracture. You may use Tylenol and Robaxin as prescribed for pain.  Do not drive or operate heavy machinery while taking Robaxin. Please return to the emergency department for any new or worsening symptoms.  Contact a health care provider if: Your pain or swelling gets worse even with treatment. You have trouble moving your finger. Get help right away if: Your finger becomes numb or blue. Contact a health care provider if: Your muscle pain gets worse and medicines do not help. You have muscle pain that lasts longer than 3 days. You have a rash or fever along with muscle pain. You have muscle pain after a tick bite. You have muscle pain while working out, even though you are in good physical condition. You have redness, soreness, or swelling along with muscle pain. You have muscle pain after starting a new medicine or changing the dose of a medicine. Get help right away if: You have trouble breathing. You have trouble swallowing. You have muscle pain along with a stiff neck, fever, and vomiting. You have severe muscle weakness or cannot move part of your body.

## 2017-10-08 NOTE — ED Notes (Signed)
Ortho tech at bedside 
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
## 2017-10-08 NOTE — ED Notes (Signed)
Ortho tech paged  
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
## 2017-10-08 NOTE — ED Notes (Signed)
Patient able to ambulate independently  

## 2018-02-23 ENCOUNTER — Other Ambulatory Visit: Payer: Self-pay

## 2018-02-23 ENCOUNTER — Emergency Department (HOSPITAL_COMMUNITY)
Admission: EM | Admit: 2018-02-23 | Discharge: 2018-02-23 | Disposition: A | Discharge status: Home / Self Care | Payer: Self-pay | Attending: Emergency Medicine | Admitting: Emergency Medicine

## 2018-02-23 ENCOUNTER — Encounter (HOSPITAL_COMMUNITY): Payer: Self-pay | Admitting: Emergency Medicine

## 2018-02-23 DIAGNOSIS — M542 Cervicalgia: Secondary | ICD-10-CM | POA: Insufficient documentation

## 2018-02-23 DIAGNOSIS — F17290 Nicotine dependence, other tobacco product, uncomplicated: Secondary | ICD-10-CM | POA: Insufficient documentation

## 2018-02-23 DIAGNOSIS — M62838 Other muscle spasm: Secondary | ICD-10-CM | POA: Insufficient documentation

## 2018-02-23 MED ORDER — PREDNISONE 10 MG (21) PO TBPK: ORAL_TABLET | Freq: Every day | ORAL | 0 refills | Status: DC

## 2018-02-23 MED ORDER — CYCLOBENZAPRINE HCL 10 MG PO TABS: 10.0000 mg | ORAL_TABLET | Freq: Two times a day (BID) | ORAL | 0 refills | Status: DC | PRN

## 2018-02-23 NOTE — ED Provider Notes (Signed)
[facility] EMERGENCY DEPARTMENT Provider Note   CSN: [id number] Arrival date & time: 02/23/18  1918     History   Chief Complaint Chief Complaint  Patient presents with  . Back Pain  . Neck Pain    HPI [name] [name] is a 42 y.o. female with history of anemia and arthritis presents for evaluation of acute onset, for aggressively worsening left-sided neck pain for 1 week.  States pain is intermittent, aching and pressure-like, worsens with movement of the neck to the left.  She notes pain intermittently travels down the left upper extremity into the first digit.  Unsure if there has been any numbness or tingling, no recent injury.  Denies fevers or chills but notes she just generally does not feel well.  Denies headaches, vision changes, chest pain, shortness of breath, abdominal pain, or vomiting.  Has tried ibuprofen, Aleve, and Tylenol without significant relief of her symptoms.  She reports that she has had similar symptoms in the past which have improved with Flexeril.  The history is provided by the patient.    Past Medical History:  Diagnosis Date  . Anemia   . Arthritis     Patient Active Problem List   Diagnosis Date Noted  . Neck pain 03/15/2016  . Muscle strain 03/15/2016    Past Surgical History:  Procedure Laterality Date  . CESAREAN SECTION       OB History   None      Home Medications    Prior to Admission medications   Medication Sig Start Date End Date Taking? Authorizing Provider  acetaminophen (TYLENOL) 325 MG tablet Take 1 tablet (325 mg total) by mouth every 6 (six) hours as needed. 10/08/17   Bill Salinas, PA-C  aspirin-acetaminophen-caffeine (EXCEDRIN MIGRAINE) [phone] MG tablet Take 1 tablet by mouth every 6 (six) hours as needed for headache.    [provider]  cyclobenzaprine (FLEXERIL) 10 MG tablet Take 1 tablet (10 mg total) by mouth 2 (two) times daily as needed. 02/23/18   Kaiyah Eber A, PA-C    methocarbamol (ROBAXIN) 500 MG tablet Take 1 tablet (500 mg total) by mouth 2 (two) times daily. 10/08/17   Harlene Salts A, PA-C  predniSONE (STERAPRED UNI-PAK 21 TAB) 10 MG (21) TBPK tablet Take by mouth daily. Take 6 tabs by mouth daily  for 1 days, then 5 tabs for 1 days, then 4 tabs for 1 days, then 3 tabs for 1 days, 2 tabs for 1 days, then 1 tab by mouth daily for 1 days 02/23/18   Jeanie Sewer, PA-C    Family History No family history on file.  Social History Social History   Tobacco Use  . Smoking status: Current Every Day Smoker    Packs/day: 0.20    Types: Cigars  . Smokeless tobacco: Never Used  Substance Use Topics  . Alcohol use: No  . Drug use: No     Allergies   Patient has no known allergies.   Review of Systems Review of Systems  Constitutional: Negative for chills and fever.  Respiratory: Negative for shortness of breath.   Cardiovascular: Negative for chest pain.  Gastrointestinal: Negative for abdominal pain and vomiting.  Musculoskeletal: Positive for neck pain. Negative for back pain.  Neurological: Negative for weakness, light-headedness and headaches.  All other systems reviewed and are negative.    Physical Exam Updated Vital Signs BP 114/75 (BP Location: Left Arm)   Pulse 76   Temp 98.2 F (  36.8 C) (Oral)   Resp 16   SpO2 100%   Physical Exam  Constitutional: She is oriented to person, place, and time. She appears well-developed and well-nourished. No distress.  HENT:  Head: Normocephalic and atraumatic.  Eyes: Conjunctivae are normal. Right eye exhibits no discharge. Left eye exhibits no discharge.  Neck: Normal range of motion. Neck supple. No JVD present. No tracheal deviation present.  Tenderness to palpation at around C4-C7 with left paracervical muscle tenderness in the trapezius distribution.  No deformity, crepitus, or step-off noted.  Normal range of motion with pain elicited with extension and lateral rotation to the left.   Cardiovascular: Normal rate, regular rhythm, normal heart sounds and intact distal pulses.  Pulmonary/Chest: Effort normal and breath sounds normal. No stridor. No respiratory distress. She has no wheezes. She has no rales. She exhibits no tenderness.  Abdominal: Soft. Bowel sounds are normal. She exhibits no distension. There is no tenderness. There is no guarding.  Musculoskeletal: Normal range of motion. She exhibits no edema.  5/5 strength of BUE major muscle groups.  Ambulatory without difficulty.  Lymphadenopathy:    She has no cervical adenopathy.  Neurological: She is alert and oriented to person, place, and time. No sensory deficit. She exhibits normal muscle tone.  Fluent speech, no facial droop, sensation intact to soft touch of extremities, normal gait, and patient able to heel walk and toe walk without difficulty.   Skin: Skin is warm and dry. No erythema.  Psychiatric: She has a normal mood and affect. Her behavior is normal.  Nursing note and vitals reviewed.    ED Treatments / Results  Labs (all labs ordered are listed, but only abnormal results are displayed) Labs Reviewed - No data to display  EKG None  Radiology No results found.  Procedures Procedures (including critical care time)  Medications Ordered in ED Medications - No data to display   Initial Impression / Assessment and Plan / ED Course  I have reviewed the triage vital signs and the nursing notes.  Pertinent labs & imaging results that were available during my care of the patient were reviewed by me and considered in my medical decision making (see chart for details).     Patient presenting with 1 week of progressively worsening upper back and left-sided neck pain.  She is afebrile, vital signs are stable.  She is nontoxic in appearance.  She is neurovascularly intact.  No recent injury.  Doubt dissection, septic joint, CVA.  No chest pain to suggest MI.  Pain reproducible on palpation.  History  and physical examination suggestive of musculoskeletal pain.  She has had improvement with muscle relaxers in the past.  Conservative therapy indicated and discussed with patient.  Will discharge with steroid taper, muscle relaxer, discussed appropriate use of these medications.  Recommend follow-up with PCP for reevaluation of symptoms.  Discussed strict ED return precautions. Pt verbalized understanding of and agreement with plan and is safe for discharge home at this time.   Final Clinical Impressions(s) / ED Diagnoses   Final diagnoses:  Neck pain, acute  Muscle spasm    ED Discharge Orders         Ordered    predniSONE (STERAPRED UNI-PAK 21 TAB) 10 MG (21) TBPK tablet  Daily     02/23/18 2102    cyclobenzaprine (FLEXERIL) 10 MG tablet  2 times daily PRN     02/23/18 2102           Michela PitcherFawze, Jalal Rauch A,  PA-C 02/23/18 2103    Mancel Bale, MD 02/24/18 [phone]

## 2018-02-23 NOTE — Discharge Instructions (Addendum)
1. Medications: Take steroid taper as prescribed with food to avoid upset stomach issues.  Do not take ibuprofen, Advil, Aleve, or Motrin while taking this medicine.  You may take [phone] mg of Tylenol every 6 hours as needed for pain. Do not exceed 4000 mg of Tylenol daily.  You can take Flexeril as needed for muscle relaxation but this medication may make you drowsy so do not drive, drink alcohol, operate heavy machinery, or make important decisions while you are using this medicine.  I typically recommend only taking this medicine at night.  You can also cut these tablets in half if they are very strong. 2. Treatment: rest, ice, elevate and use brace, drink plenty of fluids, gentle stretching (see attached exercises, but you can also YouTube search cervical spine physical therapy exercises).   3. Follow Up: Please followup with orthopedics as directed or your PCP in 1 week if no improvement for discussion of your diagnoses and further evaluation after today's visit; if you do not have a primary care doctor use the resource guide provided to find one; Please return to the ER for worsening symptoms or other concerns such as worsening swelling, redness of the skin, fevers, loss of pulses, or loss of feeling

## 2018-02-23 NOTE — ED Triage Notes (Signed)
Pt reports neck and upper back pain x 2 days, reports the pain also goes into her right arm and movement makes the pain worse. States that she has been taking tylenol with no improvement in pain. Pt does not appear to be in any acute distress and is ambulatory to triage. Reports hx of similar pain but "never this bad"

## 2018-02-23 NOTE — ED Notes (Signed)
Limited movement to neck and back due to pain. No injury reported.

## 2018-02-23 NOTE — ED Notes (Signed)
Patient verbalizes understanding of discharge instructions. Opportunity for questioning and answers were provided. Armband removed by staff, pt discharged from ED ambulatory.   

## 2018-06-03 ENCOUNTER — Encounter (HOSPITAL_COMMUNITY): Payer: Self-pay

## 2018-06-03 ENCOUNTER — Ambulatory Visit (HOSPITAL_COMMUNITY)
Admission: EM | Admit: 2018-06-03 | Discharge: 2018-06-03 | Disposition: A | Discharge status: Home / Self Care | Payer: Self-pay | Attending: Family Medicine | Admitting: Family Medicine

## 2018-06-03 ENCOUNTER — Ambulatory Visit (INDEPENDENT_AMBULATORY_CARE_PROVIDER_SITE_OTHER): Payer: Self-pay

## 2018-06-03 DIAGNOSIS — R0789 Other chest pain: Secondary | ICD-10-CM

## 2018-06-03 DIAGNOSIS — R0602 Shortness of breath: Secondary | ICD-10-CM

## 2018-06-03 DIAGNOSIS — J029 Acute pharyngitis, unspecified: Secondary | ICD-10-CM

## 2018-06-03 NOTE — Discharge Instructions (Signed)
You have been seen at the  Urgent Care today for chest pain. Your evaluation today was not suggestive of any emergent condition requiring medical intervention at this time. Your ECG (heart tracing) and chest x-ray did not show any worrisome changes. However, some medical problems make take more time to appear. Therefore, it's very important that you pay attention to any new symptoms or worsening of your current condition.  Please proceed directly to the Emergency Department immediately should you feel worse in any way or have any of the following symptoms: increasing or different chest pain, pain that spreads to your arm, neck, jaw, back or abdomen, shortness of breath, or nausea and vomiting.  

## 2018-06-03 NOTE — ED Triage Notes (Signed)
Pt presents with sore throat since Saturday; causing shoulder and chest pressure.

## 2018-06-03 NOTE — ED Provider Notes (Signed)
[facility]   [id number] 06/03/18 Arrival Time: 1501  ASSESSMENT & PLAN:  1. Sore throat   2. Chest pressure   3. SOB (shortness of breath)    Suspect viral illness. I have personally viewed the imaging studies ordered this visit. No pneumonia or pneumothorax seen. Work note provided.  No suspicion for strep throat. Likely post-nasal drainage related. Discussed typical duration of symptoms. OTC symptom care as needed. Ensure adequate fluid intake and rest. May f/u with PCP or here as needed.  Reviewed expectations re: course of current medical issues. Questions answered. Outlined signs and symptoms indicating need for more acute intervention. Patient verbalized understanding. After Visit Summary given.   SUBJECTIVE: History from: patient.  [name] [name] is a 43 y.o. female who presents with complaint of nasal congestion, post-nasal drainage, and a mild dry cough; with sore throat. Onset abrupt, 6-7 d ago; with mild fatigue and without body aches. SOB: "maybe when I cough a lot"; none at rest. Wheezing: none. Does reports sporadic "chest pressure"; more with coughing. Fever: unsure; questions chills. Overall normal PO intake without n/v. Known sick contacts: no. No specific or significant aggravating or alleviating factors reported. OTC treatment: none reported.  Social History   Tobacco Use  Smoking Status Current Every Day Smoker  . Packs/day: 0.20  . Types: Cigars  Smokeless Tobacco Never Used    ROS: As per HPI. All other systems negative.    OBJECTIVE:  Vitals:   06/03/18 1532  BP: (!) 147/76  Pulse: 71  Resp: 18  Temp: 97.8 F (36.6 C)  TempSrc: Oral  SpO2: 100%     General appearance: alert; appears fatigued HEENT: nasal congestion; clear runny nose; throat irritation secondary to post-nasal drainage Neck: supple without LAD CV: RRR Lungs: unlabored respirations, symmetrical air entry without wheezing; cough: moderate and dry Abd:  soft Ext: no LE edema Skin: warm and dry Psychological: alert and cooperative; normal mood and affect  Imaging: Dg Chest 2 View  Result Date: 06/03/2018 CLINICAL DATA:  Chest pain and shortness of breath for 2 days. EXAM: CHEST - 2 VIEW COMPARISON:  12/07/2016 FINDINGS: The heart size and mediastinal contours are within normal limits. Both lungs are clear. The visualized skeletal structures are unremarkable. IMPRESSION: No active cardiopulmonary disease. Electronically Signed   By: Myles Rosenthal M.D.   On: 06/03/2018 16:31    No Known Allergies  Past Medical History:  Diagnosis Date  . Anemia   . Arthritis    History reviewed. No pertinent family history. Social History   Socioeconomic History  . Marital status: Single    Spouse name: Not on file  . Number of children: Not on file  . Years of education: Not on file  . Highest education level: Not on file  Occupational History  . Not on file  Social Needs  . Financial resource strain: Not on file  . Food insecurity:    Worry: Not on file    Inability: Not on file  . Transportation needs:    Medical: Not on file    Non-medical: Not on file  Tobacco Use  . Smoking status: Current Every Day Smoker    Packs/day: 0.20    Types: Cigars  . Smokeless tobacco: Never Used  Substance and Sexual Activity  . Alcohol use: No  . Drug use: No  . Sexual activity: Yes    Birth control/protection: None  Lifestyle  . Physical activity:    Days per week: Not on file  Minutes per session: Not on file  . Stress: Not on file  Relationships  . Social connections:    Talks on phone: Not on file    Gets together: Not on file    Attends religious service: Not on file    Active member of club or organization: Not on file    Attends meetings of clubs or organizations: Not on file    Relationship status: Not on file  . Intimate partner violence:    Fear of current or ex partner: Not on file    Emotionally abused: Not on file     Physically abused: Not on file    Forced sexual activity: Not on file  Other Topics Concern  . Not on file  Social History Narrative  . Not on file           Mardella Layman, MD 06/18/18 [phone]

## 2019-06-03 ENCOUNTER — Ambulatory Visit: Payer: Self-pay | Attending: Internal Medicine

## 2020-04-26 ENCOUNTER — Other Ambulatory Visit: Payer: Self-pay

## 2020-04-26 ENCOUNTER — Encounter (HOSPITAL_COMMUNITY): Payer: Self-pay

## 2020-04-26 ENCOUNTER — Ambulatory Visit (HOSPITAL_COMMUNITY)
Admission: EM | Admit: 2020-04-26 | Discharge: 2020-04-26 | Disposition: A | Discharge status: Home / Self Care | Payer: HRSA Program | Attending: Family Medicine | Admitting: Family Medicine

## 2020-04-26 DIAGNOSIS — F17290 Nicotine dependence, other tobacco product, uncomplicated: Secondary | ICD-10-CM | POA: Diagnosis not present

## 2020-04-26 DIAGNOSIS — J029 Acute pharyngitis, unspecified: Secondary | ICD-10-CM | POA: Diagnosis not present

## 2020-04-26 DIAGNOSIS — Z20822 Contact with and (suspected) exposure to covid-19: Secondary | ICD-10-CM | POA: Insufficient documentation

## 2020-04-26 LAB — SARS CORONAVIRUS 2 (TAT 6-24 HRS): SARS Coronavirus 2: NEGATIVE

## 2020-04-26 NOTE — ED Provider Notes (Addendum)
  [facility]   [id number] 04/26/20 Arrival Time: 1248  ASSESSMENT & PLAN:  1. Sore throat     Likely viral illness. No signs of strep throat. COVID-19 testing sent. See letter/work note on file for self-isolation guidelines. OTC symptom care as needed.    Follow-up Information     Urgent Care at [facility].   Specialty: Urgent Care Why: As needed. Contact information: [address] [place] [address] [phone]              Reviewed expectations re: course of current medical issues. Questions answered. Outlined signs and symptoms indicating need for more acute intervention. Understanding verbalized. After Visit Summary given.   SUBJECTIVE: History from: patient. [name] [name] is a 45 y.o. female who presents with worries regarding COVID-19. Known COVID-19 contact: none. Recent travel: none. Reports: mild irritated throat. Denies: congestion, fever, cough, difficulty breathing and headache. Normal PO intake without n/v/d.    OBJECTIVE:  Vitals:   04/26/20 1400  BP: 120/70  Pulse: 67  Resp: 18  Temp: 98.7 F (37.1 C)  TempSrc: Oral  SpO2: 100%    General appearance: alert; no distress Eyes: PERRLA; EOMI; conjunctiva normal HENT: [place]; AT; without nasal congestion; throat with very mild irritation; tonsils normal Neck: supple s LAD Lungs: speaks full sentences without difficulty; unlabored Extremities: no edema Skin: warm and dry Neurologic: normal gait Psychological: alert and cooperative; normal mood and affect  Labs:  Labs Reviewed  SARS CORONAVIRUS 2 (TAT 6-24 HRS)     No Known Allergies  Past Medical History:  Diagnosis Date  . Anemia   . Arthritis    Social History   Socioeconomic History  . Marital status: Single    Spouse name: Not on file  . Number of children: Not on file  . Years of education: Not on file  . Highest education level: Not on file  Occupational History  . Not on file   Tobacco Use  . Smoking status: Current Every Day Smoker    Packs/day: 0.20    Types: Cigars  . Smokeless tobacco: Never Used  Substance and Sexual Activity  . Alcohol use: No  . Drug use: No  . Sexual activity: Yes    Birth control/protection: None  Other Topics Concern  . Not on file  Social History Narrative  . Not on file   Social Determinants of Health   Financial Resource Strain: Not on file  Food Insecurity: Not on file  Transportation Needs: Not on file  Physical Activity: Not on file  Stress: Not on file  Social Connections: Not on file  Intimate Partner Violence: Not on file   Family History  Family history unknown: Yes   Past Surgical History:  Procedure Laterality Date  . CESAREAN SECTION       Mardella Layman, MD 04/26/20 1521    Mardella Layman, MD 04/26/20 [phone]

## 2020-04-26 NOTE — Discharge Instructions (Addendum)
You may use over the counter ibuprofen or acetaminophen as needed.  For a sore throat, over the counter products such as [employer] Peroxyl Mouth Sore Rinse or Chloraseptic Sore Throat Spray may provide some temporary relief. You have been tested for COVID-19 today. If your test returns positive, you will receive a phone call from [place] regarding your results. Negative test results are not called. Both positive and negative results area always visible on MyChart. If you do not have a MyChart account, sign up instructions are provided in your discharge papers. Please do not hesitate to contact us should you have questions or concerns.    

## 2020-04-26 NOTE — ED Triage Notes (Signed)
Pt presents with sore throat since yesterday.  

## 2020-05-08 ENCOUNTER — Other Ambulatory Visit: Payer: Self-pay

## 2020-05-08 ENCOUNTER — Emergency Department (HOSPITAL_COMMUNITY)
Admission: EM | Admit: 2020-05-08 | Discharge: 2020-05-09 | Disposition: A | Discharge status: Home / Self Care | Payer: Medicaid Other | Attending: Emergency Medicine | Admitting: Emergency Medicine

## 2020-05-08 DIAGNOSIS — M545 Low back pain, unspecified: Secondary | ICD-10-CM | POA: Diagnosis present

## 2020-05-08 DIAGNOSIS — Z5321 Procedure and treatment not carried out due to patient leaving prior to being seen by health care provider: Secondary | ICD-10-CM | POA: Insufficient documentation

## 2020-05-08 NOTE — ED Triage Notes (Signed)
C/o lower right back pain x4days, says it gets worse in the cold. Denies injury, urinary symptoms.

## 2020-05-09 NOTE — ED Notes (Signed)
Security stated he observed the patient walk out ad get into a car and drive away

## 2020-09-04 ENCOUNTER — Other Ambulatory Visit: Payer: Self-pay

## 2020-09-04 ENCOUNTER — Ambulatory Visit (HOSPITAL_COMMUNITY)
Admission: EM | Admit: 2020-09-04 | Discharge: 2020-09-04 | Disposition: A | Discharge status: Home / Self Care | Payer: Medicaid Other | Attending: Urgent Care | Admitting: Urgent Care

## 2020-09-04 ENCOUNTER — Encounter (HOSPITAL_COMMUNITY): Payer: Self-pay

## 2020-09-04 DIAGNOSIS — Z760 Encounter for issue of repeat prescription: Secondary | ICD-10-CM

## 2020-09-04 DIAGNOSIS — J42 Unspecified chronic bronchitis: Secondary | ICD-10-CM | POA: Diagnosis not present

## 2020-09-04 MED ORDER — ALBUTEROL SULFATE HFA 108 (90 BASE) MCG/ACT IN AERS
1.0000 | INHALATION_SPRAY | Freq: Four times a day (QID) | RESPIRATORY_TRACT | 0 refills | Status: DC | PRN
Start: 2020-09-04 — End: 2022-11-17

## 2020-09-04 MED ORDER — COMBIVENT RESPIMAT 20-100 MCG/ACT IN AERS: 1.0000 | INHALATION_SPRAY | Freq: Four times a day (QID) | RESPIRATORY_TRACT | 0 refills | Status: DC

## 2020-09-04 NOTE — ED Triage Notes (Signed)
Pt reports she needs albuterol refilled. She states the last time she used it was last year. She states she has been having SOB.

## 2020-09-04 NOTE — ED Provider Notes (Signed)
[name]   MRN: [id number] DOB: Aug 16, 1975  Subjective:   [name] [name] is a 45 y.o. female presenting for refill of her albuterol inhaler and steroid inhaler.  Patient states that she has chronic bronchitis, is a heavy smoker.  She is working on quitting.  She does not want to get COVID tested, took a home test and was negative.  Her work is requiring that she get a refill of her inhalers before she returns to work.  Her primary symptoms are having some wheezing and intermittent shortness of breath which is pretty consistent with her regular symptoms of chronic bronchitis.  Denies fever, body aches, chest pain.  No current facility-administered medications for this encounter.  Current Outpatient Medications:  .  acetaminophen (TYLENOL) 325 MG tablet, Take 1 tablet (325 mg total) by mouth every 6 (six) hours as needed., Disp: 20 tablet, Rfl: 0 .  aspirin-acetaminophen-caffeine (EXCEDRIN MIGRAINE) 250-250-65 MG tablet, Take 1 tablet by mouth every 6 (six) hours as needed for headache., Disp: , Rfl:  .  cyclobenzaprine (FLEXERIL) 10 MG tablet, Take 1 tablet (10 mg total) by mouth 2 (two) times daily as needed., Disp: 10 tablet, Rfl: 0 .  methocarbamol (ROBAXIN) 500 MG tablet, Take 1 tablet (500 mg total) by mouth 2 (two) times daily., Disp: 20 tablet, Rfl: 0   No Known Allergies  Past Medical History:  Diagnosis Date  . Anemia   . Arthritis      Past Surgical History:  Procedure Laterality Date  . CESAREAN SECTION      Family History  Family history unknown: Yes    Social History   Tobacco Use  . Smoking status: Current Every Day Smoker    Packs/day: 0.20    Types: Cigars  . Smokeless tobacco: Never Used  Substance Use Topics  . Alcohol use: No  . Drug use: No    ROS   Objective:   Vitals: BP (!) 143/84   Pulse 93   Temp 99 F (37.2 C) (Oral)   Resp 20   LMP 08/11/2020 (Exact Date)   SpO2 100%   Physical Exam Constitutional:       General: She is not in acute distress.    Appearance: Normal appearance. She is well-developed. She is not ill-appearing, toxic-appearing or diaphoretic.  HENT:     Head: Normocephalic and atraumatic.     Nose: Nose normal.     Mouth/Throat:     Mouth: Mucous membranes are moist.  Eyes:     Extraocular Movements: Extraocular movements intact.     Pupils: Pupils are equal, round, and reactive to light.  Cardiovascular:     Rate and Rhythm: Normal rate and regular rhythm.     Pulses: Normal pulses.     Heart sounds: Normal heart sounds. No murmur heard. No friction rub. No gallop.   Pulmonary:     Effort: Pulmonary effort is normal. No respiratory distress.     Breath sounds: No stridor. No wheezing, rhonchi or rales.     Comments: Slight decrease in lung sounds.  No use of accessory muscles, pursed or cyanotic lips. Skin:    General: Skin is warm and dry.     Findings: No rash.  Neurological:     Mental Status: She is alert and oriented to person, place, and time.  Psychiatric:        Mood and Affect: Mood normal.        Behavior: Behavior normal.  Thought Content: Thought content normal.      Assessment and Plan :   PDMP not reviewed this encounter.  1. Chronic bronchitis, unspecified chronic bronchitis type (HCC)   2. Medication refill     Refilled her Combivent and ProAir as discovered by Medicaid.  Patient declined COVID-19 test.  Physical exam findings and vital signs otherwise stable for outpatient management. Counseled patient on potential for adverse effects with medications prescribed/recommended today, ER and return-to-clinic precautions discussed, patient verbalized understanding.    Wallis Bamberg, PA-C 09/05/20 1112

## 2020-09-27 ENCOUNTER — Other Ambulatory Visit: Payer: Self-pay

## 2020-09-27 ENCOUNTER — Emergency Department (HOSPITAL_COMMUNITY)
Admission: EM | Admit: 2020-09-27 | Discharge: 2020-09-27 | Disposition: A | Discharge status: Home / Self Care | Payer: Medicaid Other | Attending: Emergency Medicine | Admitting: Emergency Medicine

## 2020-09-27 ENCOUNTER — Encounter (HOSPITAL_COMMUNITY): Payer: Self-pay

## 2020-09-27 DIAGNOSIS — F17210 Nicotine dependence, cigarettes, uncomplicated: Secondary | ICD-10-CM | POA: Insufficient documentation

## 2020-09-27 DIAGNOSIS — G8929 Other chronic pain: Secondary | ICD-10-CM | POA: Insufficient documentation

## 2020-09-27 DIAGNOSIS — R519 Headache, unspecified: Secondary | ICD-10-CM | POA: Diagnosis not present

## 2020-09-27 DIAGNOSIS — M546 Pain in thoracic spine: Secondary | ICD-10-CM | POA: Diagnosis present

## 2020-09-27 MED ORDER — DICLOFENAC SODIUM 75 MG PO TBEC: 75.0000 mg | DELAYED_RELEASE_TABLET | Freq: Two times a day (BID) | ORAL | 0 refills | Status: DC

## 2020-09-27 MED ORDER — KETOROLAC TROMETHAMINE 60 MG/2ML IM SOLN
60.0000 mg | Freq: Once | INTRAMUSCULAR | Status: AC
Start: 2020-09-27 — End: 2020-09-27
  Administered 2020-09-27: 21:00:00 60 mg via INTRAMUSCULAR
  Filled 2020-09-27: qty 2

## 2020-09-27 MED ORDER — METHOCARBAMOL 500 MG PO TABS: 500.0000 mg | ORAL_TABLET | Freq: Four times a day (QID) | ORAL | 0 refills | Status: DC

## 2020-09-27 NOTE — ED Provider Notes (Signed)
[facility] EMERGENCY DEPARTMENT Provider Note   CSN: [id number] Arrival date & time: 09/27/20  1145     History Chief Complaint  Patient presents with   Muscle Pain    [name] [name] is a 45 y.o. female.  The history is provided by the patient. No language interpreter was used.  Muscle Pain This is a new problem. The current episode started more than 1 week ago. The problem occurs constantly. The problem has not changed since onset.Nothing aggravates the symptoms. She has tried nothing for the symptoms. The treatment provided no relief.    Pt complains of pain in her back and a headache.  Pt reports she has had this in the past.  No current MD.  No relief with OTc medications    Past Medical History:  Diagnosis Date   Anemia    Arthritis     Patient Active Problem List   Diagnosis Date Noted   Neck pain 03/15/2016   Muscle strain 03/15/2016    Past Surgical History:  Procedure Laterality Date   CESAREAN SECTION       OB History   No obstetric history on file.     Family History  Family history unknown: Yes    Social History   Tobacco Use   Smoking status: Every Day    Packs/day: 0.20    Pack years: 0.00    Types: Cigars, Cigarettes   Smokeless tobacco: Never  Substance Use Topics   Alcohol use: No   Drug use: No    Home Medications Prior to Admission medications   Medication Sig Start Date End Date Taking? Authorizing Provider  acetaminophen (TYLENOL) 325 MG tablet Take 1 tablet (325 mg total) by mouth every 6 (six) hours as needed. 10/08/17   Harlene Salts A, PA-C  albuterol (VENTOLIN HFA) 108 (90 Base) MCG/ACT inhaler Inhale 1-2 puffs into the lungs every 6 (six) hours as needed for wheezing or shortness of breath. 09/04/20   Wallis Bamberg, PA-C  aspirin-acetaminophen-caffeine (EXCEDRIN MIGRAINE) [phone] MG tablet Take 1 tablet by mouth every 6 (six) hours as needed for headache.    [provider]  cyclobenzaprine  (FLEXERIL) 10 MG tablet Take 1 tablet (10 mg total) by mouth 2 (two) times daily as needed. 02/23/18   Fawze, Mina A, PA-C  Ipratropium-Albuterol (COMBIVENT RESPIMAT) 20-100 MCG/ACT AERS respimat Inhale 1 puff into the lungs every 6 (six) hours. 09/04/20   Wallis Bamberg, PA-C  methocarbamol (ROBAXIN) 500 MG tablet Take 1 tablet (500 mg total) by mouth 2 (two) times daily. 10/08/17   Bill Salinas, PA-C    Allergies    Patient has no known allergies.  Review of Systems   Review of Systems  All other systems reviewed and are negative.  Physical Exam Updated Vital Signs BP 127/67 (BP Location: Right Arm)   Pulse 67   Temp 98.4 F (36.9 C) (Oral)   Resp 16   SpO2 99%   Physical Exam Vitals and nursing note reviewed.  Constitutional:      Appearance: She is well-developed.  HENT:     Head: Normocephalic.  Cardiovascular:     Rate and Rhythm: Normal rate.     Pulses: Normal pulses.  Pulmonary:     Effort: Pulmonary effort is normal.  Abdominal:     General: There is no distension.  Musculoskeletal:        General: Normal range of motion.     Cervical back: Normal range of motion and  neck supple.     Comments: Tender left lateral thoracic spine,  pain with movement     Neurological:     General: No focal deficit present.     Mental Status: She is alert and oriented to person, place, and time.  Psychiatric:        Mood and Affect: Mood normal.    ED Results / Procedures / Treatments   Labs (all labs ordered are listed, but only abnormal results are displayed) Labs Reviewed - No data to display  EKG None  Radiology No results found.  Procedures Procedures   Medications Ordered in ED Medications  ketorolac (TORADOL) injection 60 mg (has no administration in time range)    ED Course  I have reviewed the triage vital signs and the nursing notes.  Pertinent labs & imaging results that were available during my care of the patient were reviewed by me and considered  in my medical decision making (see chart for details).    MDM Rules/Calculators/A&P                          MDM:  Pt given toradol 60 mg IM.  Rx for voltaren and robaxin.  Pt advised to follow up with primary care for recheck  Final Clinical Impression(s) / ED Diagnoses Final diagnoses:  Chronic thoracic back pain, unspecified back pain laterality  Nonintractable headache, unspecified chronicity pattern, unspecified headache type    Rx / DC Orders ED Discharge Orders          Ordered    diclofenac (VOLTAREN) 75 MG EC tablet  2 times daily        09/27/20 2050    methocarbamol (ROBAXIN) 500 MG tablet  4 times daily        09/27/20 2050          An After Visit Summary was printed and given to the patient.    Osie Cheeks 09/27/20 2051    Wynetta Fines, MD 09/28/20 2325

## 2020-09-27 NOTE — ED Notes (Signed)
Pt didn't answered when called for vitals  

## 2020-09-27 NOTE — Discharge Instructions (Addendum)
Followup primary care 

## 2020-09-27 NOTE — ED Triage Notes (Signed)
Pt presents with chronic ongoing "spine pain" for "a while" pt reports she's been seen here several times for the same. Reports the pain is in her posterior neck radiating to her Right shoulder. Pt taking OTC meds with no relief.   Pt on the phone screaming while in the waiting room and back in the triage area

## 2020-09-27 NOTE — ED Notes (Signed)
Pt has been called 2x for room, no response.

## 2020-09-27 NOTE — ED Provider Notes (Signed)
Emergency Medicine Provider Triage Evaluation Note  [name] [name] , a 45 y.o. female  was evaluated in triage.  Pt complains of pain in her right-sided lateral neck and shoulder.  She states that she has been here multiple times for this before.  She states that she has tried Tylenol without relief.  She does not know of anything specific that helps.  She denies any fevers or recent injury.  She does state that when she uses a heating pad at night that helps alleviate her pain temporarily.  She denies any weakness in the right arm.  Review of Systems  Positive: Pain in the right shoulder and right-sided neck Negative: Weakness, numbness, or tingling  Physical Exam  BP (!) 183/89 (BP Location: Left Arm)   Pulse 66   Temp 98.4 F (36.9 C) (Oral)   Resp 16   SpO2 100%  Gen:   Awake, no distress   Resp:  Normal effort  MSK:   Moves extremities without difficulty  Other:  Patient is able to hold the phone with her right hand and has grossly intact coordination to operate her phone.  Normal gait.  Medical Decision Making  Medically screening exam initiated at 1:37 PM.  Appropriate orders placed.  Shirlie Enck was informed that the remainder of the evaluation will be completed by another provider, this initial triage assessment does not replace that evaluation, and the importance of remaining in the ED until their evaluation is complete.  Note: Portions of this report may have been transcribed using voice recognition software. Every effort was made to ensure accuracy; however, inadvertent computerized transcription errors may be present    Cristina Gong, PA-C 09/27/20 1338    Pricilla Loveless, MD 09/30/20 [phone]

## 2020-09-27 NOTE — ED Notes (Signed)
Patient states she was outside and did not hear a call for room.

## 2020-11-24 DIAGNOSIS — Z28310 Unvaccinated for covid-19: Secondary | ICD-10-CM | POA: Insufficient documentation

## 2020-11-24 DIAGNOSIS — Z5321 Procedure and treatment not carried out due to patient leaving prior to being seen by health care provider: Secondary | ICD-10-CM | POA: Insufficient documentation

## 2020-11-24 DIAGNOSIS — U071 COVID-19: Secondary | ICD-10-CM | POA: Insufficient documentation

## 2020-11-24 DIAGNOSIS — R509 Fever, unspecified: Secondary | ICD-10-CM | POA: Diagnosis present

## 2020-11-25 ENCOUNTER — Emergency Department (HOSPITAL_COMMUNITY)
Admission: EM | Admit: 2020-11-25 | Discharge: 2020-11-25 | Disposition: A | Discharge status: Home / Self Care | Payer: Medicaid Other | Attending: Emergency Medicine | Admitting: Emergency Medicine

## 2020-11-25 ENCOUNTER — Other Ambulatory Visit: Payer: Self-pay

## 2020-11-25 ENCOUNTER — Encounter (HOSPITAL_COMMUNITY): Payer: Self-pay

## 2020-11-25 ENCOUNTER — Emergency Department (HOSPITAL_COMMUNITY): Payer: Medicaid Other

## 2020-11-25 ENCOUNTER — Emergency Department (HOSPITAL_COMMUNITY)
Admission: EM | Admit: 2020-11-25 | Discharge: 2020-11-26 | Discharge status: Against Medical Advice | Payer: Medicaid Other | Source: Home / Self Care | Attending: Emergency Medicine | Admitting: Emergency Medicine

## 2020-11-25 DIAGNOSIS — Z5321 Procedure and treatment not carried out due to patient leaving prior to being seen by health care provider: Secondary | ICD-10-CM | POA: Insufficient documentation

## 2020-11-25 DIAGNOSIS — R509 Fever, unspecified: Secondary | ICD-10-CM | POA: Insufficient documentation

## 2020-11-25 LAB — COMPREHENSIVE METABOLIC PANEL
ALT: 33 U/L (ref 0–44)
AST: 34 U/L (ref 15–41)
Albumin: 3.3 g/dL — ABNORMAL LOW (ref 3.5–5.0)
Alkaline Phosphatase: 55 U/L (ref 38–126)
Anion gap: 9 (ref 5–15)
BUN: 6 mg/dL (ref 6–20)
CO2: 22 mmol/L (ref 22–32)
Calcium: 8.4 mg/dL — ABNORMAL LOW (ref 8.9–10.3)
Chloride: 102 mmol/L (ref 98–111)
Creatinine, Ser: 0.72 mg/dL (ref 0.44–1.00)
GFR, Estimated: >60 mL/min (ref >60)
Glucose, Bld: 114 mg/dL — ABNORMAL HIGH (ref 70–99)
Potassium: 3.2 mmol/L — ABNORMAL LOW (ref 3.5–5.1)
Sodium: 133 mmol/L — ABNORMAL LOW (ref 135–145)
Total Bilirubin: 0.4 mg/dL (ref 0.3–1.2)
Total Protein: 6.6 g/dL (ref 6.5–8.1)

## 2020-11-25 LAB — CBC WITH DIFFERENTIAL/PLATELET
Abs Immature Granulocytes: 0.02 10*3/uL (ref 0.00–0.07)
Basophils Absolute: 0 10*3/uL (ref 0.0–0.1)
Basophils Relative: 0 %
Eosinophils Absolute: 0 10*3/uL (ref 0.0–0.5)
Eosinophils Relative: 0 %
HCT: 36.8 % (ref 36.0–46.0)
Hemoglobin: 11.8 g/dL — ABNORMAL LOW (ref 12.0–15.0)
Immature Granulocytes: 1 %
Lymphocytes Relative: 11 %
Lymphs Abs: 0.3 10*3/uL — ABNORMAL LOW (ref 0.7–4.0)
MCH: 27.8 pg (ref 26.0–34.0)
MCHC: 32.1 g/dL (ref 30.0–36.0)
MCV: 86.6 fL (ref 80.0–100.0)
Monocytes Absolute: 0.4 10*3/uL (ref 0.1–1.0)
Monocytes Relative: 12 %
Neutro Abs: 2.4 10*3/uL (ref 1.7–7.7)
Neutrophils Relative %: 76 %
Platelets: 196 10*3/uL (ref 150–400)
RBC: 4.25 MIL/uL (ref 3.87–5.11)
RDW: 16.4 % — ABNORMAL HIGH (ref 11.5–15.5)
WBC: 3.2 10*3/uL — ABNORMAL LOW (ref 4.0–10.5)
nRBC: 0 % (ref 0.0–0.2)

## 2020-11-25 LAB — BASIC METABOLIC PANEL
Anion gap: 8 (ref 5–15)
BUN: 8 mg/dL (ref 6–20)
CO2: 21 mmol/L — ABNORMAL LOW (ref 22–32)
Calcium: 8.2 mg/dL — ABNORMAL LOW (ref 8.9–10.3)
Chloride: 105 mmol/L (ref 98–111)
Creatinine, Ser: 0.55 mg/dL (ref 0.44–1.00)
GFR, Estimated: >60 mL/min (ref >60)
Glucose, Bld: 120 mg/dL — ABNORMAL HIGH (ref 70–99)
Potassium: 3.4 mmol/L — ABNORMAL LOW (ref 3.5–5.1)
Sodium: 134 mmol/L — ABNORMAL LOW (ref 135–145)

## 2020-11-25 LAB — CBC
HCT: 30.5 % — ABNORMAL LOW (ref 36.0–46.0)
Hemoglobin: 9.6 g/dL — ABNORMAL LOW (ref 12.0–15.0)
MCH: 27.7 pg (ref 26.0–34.0)
MCHC: 31.5 g/dL (ref 30.0–36.0)
MCV: 88.2 fL (ref 80.0–100.0)
Platelets: 190 10*3/uL (ref 150–400)
RBC: 3.46 MIL/uL — ABNORMAL LOW (ref 3.87–5.11)
RDW: 16.6 % — ABNORMAL HIGH (ref 11.5–15.5)
WBC: 3.3 10*3/uL — ABNORMAL LOW (ref 4.0–10.5)
nRBC: 0 % (ref 0.0–0.2)

## 2020-11-25 LAB — I-STAT BETA HCG BLOOD, ED (MC, WL, AP ONLY): I-stat hCG, quantitative: <5 m[IU]/mL (ref <5)

## 2020-11-25 LAB — SARS CORONAVIRUS 2 (TAT 6-24 HRS): SARS Coronavirus 2: POSITIVE — AB

## 2020-11-25 LAB — TROPONIN I (HIGH SENSITIVITY): Troponin I (High Sensitivity): 7 ng/L (ref <18)

## 2020-11-25 NOTE — ED Notes (Signed)
No answer for VS x3 

## 2020-11-25 NOTE — ED Triage Notes (Signed)
Pt here back from earlier today with c/o fever wanting  to know results of covid test

## 2020-11-25 NOTE — ED Provider Notes (Signed)
Emergency Medicine Provider Triage Evaluation Note  [name] [name] , [name] 45 y.o. female  was evaluated in triage.  Pt complains of CP. Located to center chest. Occasionally to neck. No numbness, weakness, Pain is constant. No exertional in nature. Also with back pain however this is chronic. COVID positive yesterday. No pleuritic CP. No LE edema, pain Review of Systems  Positive: Cp, cough, back pain Negative: Fever, abd pain, emesis  Physical Exam  There were no vitals taken for this visit. Gen:   Awake, no distress   Resp:  Normal effort  MSK:   Moves extremities without difficulty  Other:    Medical Decision Making  Medically screening exam initiated at 8:53 PM.  Appropriate orders placed.  [name] [name] was informed that the remainder of the evaluation will be completed by another provider, this initial triage assessment does not replace that evaluation, and the importance of remaining in the ED until their evaluation is complete.  CP, covid positive   [name] Peace A, PA-C 11/25/20 2055    [name], MD 11/25/20 2133

## 2020-11-25 NOTE — ED Triage Notes (Signed)
Fever and generalized body weakness and aches x today. Not vaccinated.   Denies any nausea, vomiting and diarrhea. Not in resp distress. [id number]

## 2020-11-26 NOTE — ED Notes (Signed)
Called pt multiple times for vitals recheck and blood  But pt no where to be found

## 2020-12-27 ENCOUNTER — Ambulatory Visit (HOSPITAL_COMMUNITY)
Admission: EM | Admit: 2020-12-27 | Discharge: 2020-12-27 | Disposition: A | Discharge status: Home / Self Care | Payer: Medicaid Other

## 2020-12-27 ENCOUNTER — Encounter (HOSPITAL_COMMUNITY): Payer: Self-pay

## 2020-12-27 ENCOUNTER — Other Ambulatory Visit: Payer: Self-pay

## 2020-12-27 DIAGNOSIS — U071 COVID-19: Secondary | ICD-10-CM

## 2020-12-27 NOTE — ED Triage Notes (Signed)
Pt states had COVID 8/26 and needs to be released to go back to work. Pt states still has alittle nasal congestion but denies any other sx's.

## 2020-12-27 NOTE — ED Provider Notes (Signed)
[facility]    CSN: [id number] Arrival date & time: 12/27/20  1450      History   Chief Complaint Chief Complaint  Patient presents with   Nasal Congestion    HPI [name] [name] is a 45 y.o. female.   Pt states had COVID 8/26 and needs to be released to go back to work. Pt states still has alittle nasal congestion but denies any other sx's, patient states overall she is feeling well today, just needs a note to return to work.  The history is provided by the patient.   Past Medical History:  Diagnosis Date   Anemia    Arthritis     Patient Active Problem List   Diagnosis Date Noted   Neck pain 03/15/2016   Muscle strain 03/15/2016    Past Surgical History:  Procedure Laterality Date   CESAREAN SECTION      OB History   No obstetric history on file.      Home Medications    Prior to Admission medications   Medication Sig Start Date End Date Taking? Authorizing Provider  acetaminophen (TYLENOL) 325 MG tablet Take 1 tablet (325 mg total) by mouth every 6 (six) hours as needed. 10/08/17   Harlene Salts A, PA-C  albuterol (VENTOLIN HFA) 108 (90 Base) MCG/ACT inhaler Inhale 1-2 puffs into the lungs every 6 (six) hours as needed for wheezing or shortness of breath. 09/04/20   Wallis Bamberg, PA-C  aspirin-acetaminophen-caffeine (EXCEDRIN MIGRAINE) [id number] MG tablet Take 1 tablet by mouth every 6 (six) hours as needed for headache.    [provider]  cyclobenzaprine (FLEXERIL) 10 MG tablet Take 1 tablet (10 mg total) by mouth 2 (two) times daily as needed. 02/23/18   Fawze, Mina A, PA-C  diclofenac (VOLTAREN) 75 MG EC tablet Take 1 tablet (75 mg total) by mouth 2 (two) times daily. 09/27/20   Elson Areas, PA-C  Ipratropium-Albuterol (COMBIVENT RESPIMAT) 20-100 MCG/ACT AERS respimat Inhale 1 puff into the lungs every 6 (six) hours. 09/04/20   Wallis Bamberg, PA-C  methocarbamol (ROBAXIN) 500 MG tablet Take 1 tablet (500 mg total) by mouth 4 (four)  times daily. 09/27/20   Elson Areas, PA-C    Family History Family History  Family history unknown: Yes    Social History Social History   Tobacco Use   Smoking status: Every Day    Packs/day: 0.20    Types: Cigars, Cigarettes   Smokeless tobacco: Never  Substance Use Topics   Alcohol use: No   Drug use: No     Allergies   Patient has no known allergies.   Review of Systems Review of Systems Per HPI  Physical Exam Triage Vital Signs ED Triage Vitals [12/27/20 1600]  Enc Vitals Group     BP 125/84     Pulse Rate 83     Resp 18     Temp 98.4 F (36.9 C)     Temp Source Oral     SpO2 97 %     Weight      Height      Head Circumference      Peak Flow      Pain Score 0     Pain Loc      Pain Edu?      Excl. in GC?    No data found.  Updated Vital Signs BP 125/84 (BP Location: Left Arm)   Pulse 83   Temp 98.4 F (36.9 C) (Oral)  Resp 18   LMP 12/10/2020   SpO2 97%   Visual Acuity Right Eye Distance:   Left Eye Distance:   Bilateral Distance:    Right Eye Near:   Left Eye Near:    Bilateral Near:     Physical Exam Vitals and nursing note reviewed.  Constitutional:      Appearance: Normal appearance. She is obese.  HENT:     Head: Normocephalic and atraumatic.     Right Ear: Tympanic membrane, ear canal and external ear normal.     Left Ear: Tympanic membrane, ear canal and external ear normal.     Nose: Rhinorrhea present. No congestion.     Mouth/Throat:     Mouth: Mucous membranes are moist.     Pharynx: Oropharynx is clear.  Eyes:     Extraocular Movements: Extraocular movements intact.     Conjunctiva/sclera: Conjunctivae normal.     Pupils: Pupils are equal, round, and reactive to light.  Cardiovascular:     Rate and Rhythm: Normal rate and regular rhythm.     Pulses: Normal pulses.     Heart sounds: Normal heart sounds.  Pulmonary:     Effort: Pulmonary effort is normal.     Breath sounds: Normal breath sounds.   Abdominal:     General: Abdomen is flat. Bowel sounds are normal.     Palpations: Abdomen is soft.  Musculoskeletal:        General: Normal range of motion.     Cervical back: Normal range of motion and neck supple.  Skin:    General: Skin is warm and dry.  Neurological:     General: No focal deficit present.     Mental Status: She is alert and oriented to person, place, and time. Mental status is at baseline.  Psychiatric:        Mood and Affect: Mood normal.        Behavior: Behavior normal.     UC Treatments / Results  Labs (all labs ordered are listed, but only abnormal results are displayed) Labs Reviewed - No data to display  EKG   Radiology No results found.  Procedures Procedures (including critical care time)  Medications Ordered in UC Medications - No data to display  Initial Impression / Assessment and Plan / UC Course  I have reviewed the triage vital signs and the nursing notes.  Pertinent labs & imaging results that were available during my care of the patient were reviewed by me and considered in my medical decision making (see chart for details).  Patient was provided with a note to return to work that includes CDC recommendations for COVID-19 quarantine and return to work outlines.   Final Clinical Impressions(s) / UC Diagnoses   Final diagnoses:  COVID-19     Discharge Instructions      You are cleared to return to work.     ED Prescriptions   None    PDMP not reviewed this encounter.   Theadora Rama Scales, PA-C 12/27/20 1657

## 2020-12-27 NOTE — Discharge Instructions (Addendum)
You are cleared to return to work.

## 2021-02-15 ENCOUNTER — Encounter (HOSPITAL_COMMUNITY): Payer: Self-pay | Admitting: Emergency Medicine

## 2021-02-15 ENCOUNTER — Emergency Department (HOSPITAL_COMMUNITY)
Admission: EM | Admit: 2021-02-15 | Discharge: 2021-02-16 | Disposition: A | Discharge status: Home / Self Care | Payer: Medicaid Other | Attending: Emergency Medicine | Admitting: Emergency Medicine

## 2021-02-15 ENCOUNTER — Other Ambulatory Visit: Payer: Self-pay

## 2021-02-15 DIAGNOSIS — Z5321 Procedure and treatment not carried out due to patient leaving prior to being seen by health care provider: Secondary | ICD-10-CM | POA: Insufficient documentation

## 2021-02-15 DIAGNOSIS — R110 Nausea: Secondary | ICD-10-CM | POA: Insufficient documentation

## 2021-02-15 LAB — CBC WITH DIFFERENTIAL/PLATELET
Abs Immature Granulocytes: 0.04 10*3/uL (ref 0.00–0.07)
Basophils Absolute: 0 10*3/uL (ref 0.0–0.1)
Basophils Relative: 0 %
Eosinophils Absolute: 0.1 10*3/uL (ref 0.0–0.5)
Eosinophils Relative: 1 %
HCT: 37.3 % (ref 36.0–46.0)
Hemoglobin: 11.5 g/dL — ABNORMAL LOW (ref 12.0–15.0)
Immature Granulocytes: 1 %
Lymphocytes Relative: 22 %
Lymphs Abs: 1.5 10*3/uL (ref 0.7–4.0)
MCH: 27.8 pg (ref 26.0–34.0)
MCHC: 30.8 g/dL (ref 30.0–36.0)
MCV: 90.3 fL (ref 80.0–100.0)
Monocytes Absolute: 0.5 10*3/uL (ref 0.1–1.0)
Monocytes Relative: 7 %
Neutro Abs: 5 10*3/uL (ref 1.7–7.7)
Neutrophils Relative %: 69 %
Platelets: 281 10*3/uL (ref 150–400)
RBC: 4.13 MIL/uL (ref 3.87–5.11)
RDW: 17.4 % — ABNORMAL HIGH (ref 11.5–15.5)
WBC: 7.2 10*3/uL (ref 4.0–10.5)
nRBC: 0 % (ref 0.0–0.2)

## 2021-02-15 LAB — COMPREHENSIVE METABOLIC PANEL
ALT: 12 U/L (ref 0–44)
AST: 14 U/L — ABNORMAL LOW (ref 15–41)
Albumin: 3.5 g/dL (ref 3.5–5.0)
Alkaline Phosphatase: 51 U/L (ref 38–126)
Anion gap: 8 (ref 5–15)
BUN: 9 mg/dL (ref 6–20)
CO2: 22 mmol/L (ref 22–32)
Calcium: 8.6 mg/dL — ABNORMAL LOW (ref 8.9–10.3)
Chloride: 106 mmol/L (ref 98–111)
Creatinine, Ser: 0.75 mg/dL (ref 0.44–1.00)
GFR, Estimated: >60 mL/min (ref >60)
Glucose, Bld: 99 mg/dL (ref 70–99)
Potassium: 3.9 mmol/L (ref 3.5–5.1)
Sodium: 136 mmol/L (ref 135–145)
Total Bilirubin: 0.6 mg/dL (ref 0.3–1.2)
Total Protein: 7.2 g/dL (ref 6.5–8.1)

## 2021-02-15 LAB — I-STAT BETA HCG BLOOD, ED (MC, WL, AP ONLY): I-stat hCG, quantitative: <5 m[IU]/mL (ref <5)

## 2021-02-15 LAB — LIPASE, BLOOD: Lipase: 29 U/L (ref 11–51)

## 2021-02-15 NOTE — ED Triage Notes (Signed)
Pt presents to ED POV. Pt c/o nausea for a few days. Pt reports today she started having bad taste in mouth and intermittent pain in back.

## 2021-02-15 NOTE — ED Notes (Signed)
NA for vitals x3. 

## 2021-02-15 NOTE — ED Provider Notes (Signed)
Emergency Medicine Provider Triage Evaluation Note  [name] [name] , a 45 y.o. female  was evaluated in triage.  Pt complains of nausea.  Review of Systems  Positive: nausea Negative: vomiting  Physical Exam  BP (!) 153/88 (BP Location: Right Arm)   Pulse (!) 58   Temp 98.4 F (36.9 C) (Oral)   Resp 18   Ht 5\' 2"  (1.575 m)   SpO2 100%   BMI 40.24 kg/m  Gen:   Awake, no distress   Resp:  Normal effort  MSK:   Moves extremities without difficulty  Other:  Mild periumbilical ttp  Medical Decision Making  Medically screening exam initiated at 5:07 PM.  Appropriate orders placed.  Zachary Lovins was informed that the remainder of the evaluation will be completed by another provider, this initial triage assessment does not replace that evaluation, and the importance of remaining in the ED until their evaluation is complete.     [name], PA-C 02/15/21 1707    02/17/21, MD 02/16/21 [phone]

## 2021-10-08 ENCOUNTER — Emergency Department (HOSPITAL_COMMUNITY)
Admission: EM | Admit: 2021-10-08 | Discharge: 2021-10-08 | Discharge status: Against Medical Advice | Payer: Medicaid Other | Source: Home / Self Care

## 2021-10-24 ENCOUNTER — Encounter: Payer: Self-pay | Admitting: Neurology

## 2021-12-14 NOTE — Progress Notes (Unsigned)
Initial neurology clinic note  [name] [name] MRN: [id number] DOB: 04-06-1975  Referring provider: Dot Been, FNP  Primary care provider: Patient, No Pcp Per  Reason for consult:  headaches  Subjective:  This is [name], a 46 y.o. right-handed female with a medical history of chronic back pain, migraines, current smoker who presents to neurology clinic with headaches. The patient is alone.  Patient was seen at [facility] by Sherron Flemings on 10/23/21. Patient mentioned that she had headaches since 2002. She was prescribed extra strength excedrin for headaches and robaxin for chronic back pain.  Onset: at least by 2002 Quality: Pressure and pounding sensations Intensity: "11/10" Location: Neck, top of head or left face or whole face Duration: few hours to days Frequency: 15+ days Associated symptoms: occasional nausea and vomiting; neck and shoulder stiffness and tightness; occasional photophobia and phonophobia Aura: No Activity: Has to lay down and stop what she is doing. Will put a heating pad on head Aggravating factors: Flexing neck Relieving factors: Heating pad, taking excedrin  Current abortive medications: Excedrin (helps get rid of her headaches, pretty instantly; prevents nausea and vomiting); mobic and robaxin for back/neck pain Current prophylactic medications: None  Past abortive or prophylactic medications: May have had many years ago, but patient cannot recall and did not recognize names of commonly used medications. She has tried ibuprofen and tylenol in the past that did not help.  Frequency of medications: Takes excedrin every day (once every day, twice in a day if she has a headache) Family history: No Smoker: Yes Alcohol: No Caffeine: 1-2 cans per week Sleep: "Sleep too much", fatigue Mood: Been down, lost Mom in April 2023   MEDICATIONS:  Outpatient Encounter Medications as of 12/19/2021  Medication Sig   albuterol  (VENTOLIN HFA) 108 (90 Base) MCG/ACT inhaler Inhale 1-2 puffs into the lungs every 6 (six) hours as needed for wheezing or shortness of breath.   aspirin-acetaminophen-caffeine (EXCEDRIN MIGRAINE) 250-250-65 MG tablet Take 1 tablet by mouth every 6 (six) hours as needed for headache.   Ergocalciferol (VITAMIN D2 PO) Take 50,000 Units by mouth once a week.   Ipratropium-Albuterol (COMBIVENT RESPIMAT) 20-100 MCG/ACT AERS respimat Inhale 1 puff into the lungs every 6 (six) hours.   meloxicam (MOBIC) 7.5 MG tablet Take 7.5 mg by mouth daily.   methocarbamol (ROBAXIN) 500 MG tablet Take 1 tablet (500 mg total) by mouth 4 (four) times daily.   nortriptyline (PAMELOR) 10 MG capsule Take 1 capsule (10 mg total) by mouth at bedtime.   acetaminophen (TYLENOL) 325 MG tablet Take 1 tablet (325 mg total) by mouth every 6 (six) hours as needed. (Patient not taking: Reported on 12/19/2021)   cyclobenzaprine (FLEXERIL) 10 MG tablet Take 1 tablet (10 mg total) by mouth 2 (two) times daily as needed. (Patient not taking: Reported on 12/19/2021)   diclofenac (VOLTAREN) 75 MG EC tablet Take 1 tablet (75 mg total) by mouth 2 (two) times daily. (Patient not taking: Reported on 12/19/2021)   No facility-administered encounter medications on file as of 12/19/2021.    PAST MEDICAL HISTORY: Past Medical History:  Diagnosis Date   Anemia    Arthritis     PAST SURGICAL HISTORY: Past Surgical History:  Procedure Laterality Date   CESAREAN SECTION      ALLERGIES: No Known Allergies  FAMILY HISTORY: Family History  Problem Relation Age of Onset   High blood pressure Mother    High blood pressure Father  SOCIAL HISTORY: Social History   Tobacco Use   Smoking status: Every Day    Packs/day: 0.20    Types: Cigars, Cigarettes   Smokeless tobacco: Never   Tobacco comments:    3 cigars a day  Vaping Use   Vaping Use: Never used  Substance Use Topics   Alcohol use: No   Drug use: No   Social History    Social History Narrative   Rt handed   Caffeine 1-2 can a week   Live in a one story home    Objective:  Vital Signs:  BP (!) 148/80   Pulse 67   Ht 5\' 2"  (1.575 m)   Wt 224 lb (101.6 kg)   SpO2 99%   BMI 40.97 kg/m    General: No acute distress.  Patient appears well-groomed.   Head:  Normocephalic/atraumatic Eyes:  fundi examined. Disc margins clear, no obvious papilledema Neck: supple, paraspinal muscles tight and tender, reduced range of motion when turning head to right  Neurological Exam: Mental status: alert and oriented, speech fluent and not dysarthric, language intact.  Cranial nerves: CN I: not tested CN II: pupils equal, round and reactive to light, visual fields intact CN III, IV, VI:  full range of motion, no nystagmus, no ptosis CN V: facial sensation intact. CN VII: upper and lower face symmetric CN VIII: hearing intact CN IX, X: gag intact, uvula midline CN XI: sternocleidomastoid and trapezius muscles intact CN XII: tongue midline  Bulk & Tone: normal, no fasciculations. Motor:  muscle strength 5/5 throughout Deep Tendon Reflexes:  2+ throughout  Sensation:  Intact to light touch throughout. Finger to nose testing:  Without dysmetria.   Gait:  Normal station and stride.  Romberg negative.   Labs and Imaging review: External labs: HbA1c (10/23/21): 5.6 CBC significant for anemia (Hb 9.7) CMP unremarkable TSH 1.430 Vit D 4.6   Imaging: CT head wo contrast (07/05/17): FINDINGS: Brain: The brain shows a normal appearance without evidence of malformation, atrophy, old or acute small or large vessel infarction, mass lesion, hemorrhage, hydrocephalus or extra-axial collection. Slight asymmetry of the lateral ventricles is a normal variant.   Vascular: No hyperdense vessel. No evidence of atherosclerotic calcification.   Skull: Normal.  No traumatic finding.  No focal bone lesion.   Sinuses/Orbits: Sinuses are clear. Orbits appear normal.  Mastoids are clear.   Other: None significant   IMPRESSION: Normal head CT  Cervical spine xray (01/18/17): FINDINGS: Normal alignment. Negative for fracture. Disc degeneration and spurring on the right at C5-6 and C6-7 with moderate foraminal stenosis. This is unchanged from the prior study. Mild left foraminal narrowing at C4-5 and C5-6 and C6-7. Prevertebral soft tissues normal.   IMPRESSION: Disc degeneration and spurring right greater than left. No acute abnormality.  CT cervical spine wo contrast (07/02/14): FINDINGS: C1 through the cervicothoracic junction is visualized in its entirety. There is no evidence of cervical spine fracture or prevertebral soft tissue swelling. Alignment is normal with the exception of loss of the normal cervical lordosis centered at C5 which could be positional. No other significant bone abnormalities are identified. Mild disc degenerative change is noted at C5-C6 with minimal if any mass effect upon the right neural foramen at that level. Airways patent and midline.   IMPRESSION: No acute abnormality.  Mild C5-C6 disc degenerative change.  Assessment/Plan:  [name] [name] is a 46 y.o. female who presents for evaluation of headaches. She has a relevant medical history of chronic back pain,  migraines, and is a current smoker. Her neurological examination is essentially normal. However, patient has reduced range of motion of her neck, particularly when turning her head to the right. She has significant cervical paraspinal tenderness as well. Available diagnostic data is significant for vit D of 4.6. CT head in 2019 was normal.   Symptoms are most consistent with chronic migraines without aura. There is certainly a cervicogenic element to her headaches as well. She is currently controlled with Excedrin but still having at least 15 days of headaches per month. She is not currently on a preventative medication.  PLAN: Refer to physical therapy for  neck pain Start nortriptyline 10mg  at bedtime for migraine prevention Take Excedrin as needed for migraine attack and nausea Limit use of pain relievers to no more than 2 days out of week to prevent risk of rebound or medication-overuse headache. Consider Magnesium and Riboflavin Supplementation Headache lifestyle recommendations given  -Continue Vit D supplementation 50000 IU weekly for vit D deficiency  -Return to clinic in 1 month  The impression above as well as the plan as outlined below were extensively discussed with the patient who voiced understanding. All questions were answered to their satisfaction.  When available, results of the above investigations and possible further recommendations will be communicated to the patient via telephone/MyChart. Patient to call office if not contacted after expected testing turnaround time.   Total time spent reviewing records, interview, history/exam, documentation, and coordination of care on day of encounter:  48 min   Thank you for allowing me to participate in patient's care.  If I can answer any additional questions, I would be pleased to do so.  , MD   CC: Patient, No Pcp Per No address on file  CC: Referring provider: Jacquelyne Balint, FNP [address] [place],  [place]

## 2021-12-19 ENCOUNTER — Ambulatory Visit: Payer: Medicaid Other | Admitting: Neurology

## 2021-12-19 ENCOUNTER — Encounter: Payer: Self-pay | Admitting: Neurology

## 2021-12-19 VITALS — BP 148/80 | HR 67 | Ht 62.0 in | Wt 224.0 lb

## 2021-12-19 DIAGNOSIS — M542 Cervicalgia: Secondary | ICD-10-CM

## 2021-12-19 DIAGNOSIS — G43709 Chronic migraine without aura, not intractable, without status migrainosus: Secondary | ICD-10-CM | POA: Diagnosis not present

## 2021-12-19 DIAGNOSIS — G4486 Cervicogenic headache: Secondary | ICD-10-CM

## 2021-12-19 MED ORDER — NORTRIPTYLINE HCL 10 MG PO CAPS: 10.0000 mg | ORAL_CAPSULE | Freq: Every day | ORAL | 0 refills | Status: DC

## 2021-12-19 NOTE — Patient Instructions (Addendum)
I saw you today for headache.  Start nortriptyline 10 mg at bedtime for headache prevention. This has been sent to your pharmacy. Take Excedrin at earliest onset of headache.  May repeat dose once in 2 hours if needed.  Maximum 2 tablets in 24 hours. Limit use of pain relievers to no more than 2 days out of the week.  These medications include acetaminophen, NSAIDs (ibuprofen/Advil/Motrin, naproxen/Aleve, triptans (Imitrex/sumatriptan), Excedrin, and narcotics.  This will help reduce risk of rebound headaches. I am referring you to physical therapy for neck pain. Be aware of common food triggers:  - Caffeine:  coffee, black tea, cola, Mt. Dew  - Chocolate  - Dairy:  aged cheeses (brie, blue, cheddar, gouda, [place], provolone, [place], Swiss, etc), chocolate milk, buttermilk, sour cream, limit eggs and yogurt  - Nuts, peanut butter  - Alcohol  - Cereals/grains:  FRESH breads (fresh bagels, sourdough, doughnuts), yeast productions  - Processed/canned/aged/cured meats (pre-packaged deli meats, hotdogs)  - MSG/glutamate:  soy sauce, flavor enhancer, pickled/preserved/marinated foods  - Sweeteners:  aspartame (Equal, Nutrasweet).  Sugar and Splenda are okay  - Vegetables:  legumes (lima beans, lentils, snow peas, fava beans, pinto peans, peas, garbanzo beans), sauerkraut, onions, olives, pickles  - Fruit:  avocados, bananas, citrus fruit (orange, lemon, grapefruit), mango  - Other:  Frozen meals, macaroni and cheese Routine exercise Stay adequately hydrated (aim for 64 oz water daily) Keep headache diary Maintain proper stress management Maintain proper sleep hygiene Do not skip meals Vitamins that show potential  Magnesium: Magnesium (250 mg twice a day or 500 mg at bed) has a relaxant effect on smooth muscles such as blood vessels. Individuals suffering from frequent or daily headache usually have low magnesium levels which can be increase with daily supplementation of 400-750 mg. Three  trials found 40-90% average headache reduction when used as a preventative. Magnesium also demonstrated the benefit in menstrually related migraine. Magnesium is part of the messenger system in the serotonin cascade and it is a good muscle relaxant. It is also useful for constipation which can be a side effect of other medications used to treat migraine. Good sources include nuts, whole grains, and tomatoes.  Riboflavin (vitamin B 2) 200 mg twice a day. This vitamin assists nerve cells in the production of ATP a principal energy storing molecule. It is necessary for many chemical reactions in the body. There have been at least 3 clinical trials of riboflavin using 400 mg per day all of which suggested that migraine frequency can be decreased. All 3 trials showed significant improvement in over half of migraine sufferers. The supplement is found in bread, cereal, milk, meat, and poultry. Most Americans get more riboflavin than the recommended daily allowance, however riboflavin deficiency is not necessary for the supplements to help prevent headache.   Continue vitamin D supplementation.  Return to clinic in 1 month.  The physicians and staff at [facility] are committed to providing excellent care. You may receive a survey requesting feedback about your experience at our office. We strive to receive "very good" responses to the survey questions. If you feel that your experience would prevent you from giving the office a "very good " response, please contact our office to try to remedy the situation. We may be reached at [phone]. Thank you for taking the time out of your busy day to complete the survey.  Kai Levins, MD [facility]

## 2021-12-21 ENCOUNTER — Ambulatory Visit: Payer: Medicaid Other | Attending: Neurology | Admitting: Physical Therapy

## 2021-12-27 ENCOUNTER — Ambulatory Visit: Payer: Medicaid Other | Admitting: Physical Therapy

## 2022-01-02 ENCOUNTER — Ambulatory Visit: Payer: Medicaid Other | Admitting: Neurology

## 2022-01-09 ENCOUNTER — Ambulatory Visit: Payer: Medicaid Other | Attending: Neurology | Admitting: Physical Therapy

## 2022-01-09 DIAGNOSIS — M6281 Muscle weakness (generalized): Secondary | ICD-10-CM | POA: Insufficient documentation

## 2022-01-09 DIAGNOSIS — R293 Abnormal posture: Secondary | ICD-10-CM | POA: Insufficient documentation

## 2022-01-09 DIAGNOSIS — M542 Cervicalgia: Secondary | ICD-10-CM | POA: Insufficient documentation

## 2022-01-16 ENCOUNTER — Ambulatory Visit: Payer: Medicaid Other | Admitting: Physical Therapy

## 2022-01-16 DIAGNOSIS — M542 Cervicalgia: Secondary | ICD-10-CM

## 2022-01-16 DIAGNOSIS — M6281 Muscle weakness (generalized): Secondary | ICD-10-CM | POA: Diagnosis present

## 2022-01-16 DIAGNOSIS — R293 Abnormal posture: Secondary | ICD-10-CM | POA: Diagnosis present

## 2022-01-16 NOTE — Progress Notes (Deleted)
NEUROLOGY FOLLOW UP OFFICE NOTE  [name] [name] [id number]  Subjective:  [name] [name] is a 46 y.o. year old female with a history of chronic back pain, migraines, current smoker who we last saw on 12/19/21.  To briefly review: 12/19/21: Patient was seen at [facility] by Earnestine Leys on 10/23/21. Patient mentioned that she had headaches since 2002. She was prescribed extra strength excedrin for headaches and robaxin for chronic back pain.   Onset: at least by 2002 Quality: Pressure and pounding sensations Intensity: "11/10" Location: Neck, top of head or left face or whole face Duration: few hours to days Frequency: 15+ days Associated symptoms: occasional nausea and vomiting; neck and shoulder stiffness and tightness; occasional photophobia and phonophobia Aura: No Activity: Has to lay down and stop what she is doing. Will put a heating pad on head Aggravating factors: Flexing neck Relieving factors: Heating pad, taking excedrin   Current abortive medications: Excedrin (helps get rid of her headaches, pretty instantly; prevents nausea and vomiting); mobic and robaxin for back/neck pain Current prophylactic medications: None   Past abortive or prophylactic medications: May have had many years ago, but patient cannot recall and did not recognize names of commonly used medications. She has tried ibuprofen and tylenol in the past that did not help.   Frequency of medications: Takes excedrin every day (once every day, twice in a day if she has a headache) Family history: No Smoker: Yes Alcohol: No Caffeine: 1-2 cans per week Sleep: "Sleep too much", fatigue Mood: Been down, lost Mom in April 2023  Assessment: Symptoms are most consistent with chronic migraines without aura. There is certainly a cervicogenic element to her headaches as well. She is currently controlled with Excedrin but still having at least 15 days of headaches per month. She is not currently  on a preventative medication.   PLAN: Refer to physical therapy for neck pain Start nortriptyline 10mg  at bedtime for migraine prevention Take Excedrin as needed for migraine attack and nausea Limit use of pain relievers to no more than 2 days out of week to prevent risk of rebound or medication-overuse headache. Consider Magnesium and Riboflavin Supplementation Headache lifestyle recommendations given   -Continue Vit D supplementation 50000 IU weekly for vit D deficiency   -Return to clinic in 1 month  Since their last visit: ***  MEDICATIONS:  Outpatient Encounter Medications as of 01/23/2022  Medication Sig   acetaminophen (TYLENOL) 325 MG tablet Take 1 tablet (325 mg total) by mouth every 6 (six) hours as needed. (Patient not taking: Reported on 12/19/2021)   albuterol (VENTOLIN HFA) 108 (90 Base) MCG/ACT inhaler Inhale 1-2 puffs into the lungs every 6 (six) hours as needed for wheezing or shortness of breath.   aspirin-acetaminophen-caffeine (EXCEDRIN MIGRAINE) 250-250-65 MG tablet Take 1 tablet by mouth every 6 (six) hours as needed for headache.   cyclobenzaprine (FLEXERIL) 10 MG tablet Take 1 tablet (10 mg total) by mouth 2 (two) times daily as needed. (Patient not taking: Reported on 12/19/2021)   diclofenac (VOLTAREN) 75 MG EC tablet Take 1 tablet (75 mg total) by mouth 2 (two) times daily. (Patient not taking: Reported on 12/19/2021)   Ergocalciferol (VITAMIN D2 PO) Take 50,000 Units by mouth once a week.   Ipratropium-Albuterol (COMBIVENT RESPIMAT) 20-100 MCG/ACT AERS respimat Inhale 1 puff into the lungs every 6 (six) hours.   meloxicam (MOBIC) 7.5 MG tablet Take 7.5 mg by mouth daily.   methocarbamol (ROBAXIN) 500 MG tablet Take 1  tablet (500 mg total) by mouth 4 (four) times daily.   nortriptyline (PAMELOR) 10 MG capsule Take 1 capsule (10 mg total) by mouth at bedtime.   No facility-administered encounter medications on file as of 01/23/2022.    PAST MEDICAL  HISTORY: Past Medical History:  Diagnosis Date   Anemia    Arthritis     PAST SURGICAL HISTORY: Past Surgical History:  Procedure Laterality Date   CESAREAN SECTION      ALLERGIES: No Known Allergies  FAMILY HISTORY: Family History  Problem Relation Age of Onset   High blood pressure Mother    High blood pressure Father     SOCIAL HISTORY: Social History   Tobacco Use   Smoking status: Every Day    Packs/day: 0.20    Types: Cigars, Cigarettes   Smokeless tobacco: Never   Tobacco comments:    3 cigars a day  Vaping Use   Vaping Use: Never used  Substance Use Topics   Alcohol use: No   Drug use: No   Social History   Social History Narrative   Rt handed   Caffeine 1-2 can a week   Live in a one story home      Objective:  Vital Signs:  There were no vitals taken for this visit.  ***  Labs and Imaging review: No new testing reviewed.  Prior testing: External labs: HbA1c (10/23/21): 5.6 CBC significant for anemia (Hb 9.7) CMP unremarkable TSH 1.430 Vit D 4.6    Imaging: CT head wo contrast (07/05/17): FINDINGS: Brain: The brain shows a normal appearance without evidence of malformation, atrophy, old or acute small or large vessel infarction, mass lesion, hemorrhage, hydrocephalus or extra-axial collection. Slight asymmetry of the lateral ventricles is a normal variant.   Vascular: No hyperdense vessel. No evidence of atherosclerotic calcification.   Skull: Normal.  No traumatic finding.  No focal bone lesion.   Sinuses/Orbits: Sinuses are clear. Orbits appear normal. Mastoids are clear.   Other: None significant   IMPRESSION: Normal head CT   Cervical spine xray (01/18/17): FINDINGS: Normal alignment. Negative for fracture. Disc degeneration and spurring on the right at C5-6 and C6-7 with moderate foraminal stenosis. This is unchanged from the prior study. Mild left foraminal narrowing at C4-5 and C5-6 and C6-7. Prevertebral  soft tissues normal.   IMPRESSION: Disc degeneration and spurring right greater than left. No acute abnormality.   CT cervical spine wo contrast (07/02/14): FINDINGS: C1 through the cervicothoracic junction is visualized in its entirety. There is no evidence of cervical spine fracture or prevertebral soft tissue swelling. Alignment is normal with the exception of loss of the normal cervical lordosis centered at C5 which could be positional. No other significant bone abnormalities are identified. Mild disc degenerative change is noted at C5-C6 with minimal if any mass effect upon the right neural foramen at that level. Airways patent and midline.   IMPRESSION: No acute abnormality.  Mild C5-C6 disc degenerative change.  Assessment/Plan:  This is Meredeth Mellema, a 46 y.o. female with:  ***   Plan: ***  Return to clinic in ***  Total time spent reviewing records, interview, history/exam, documentation, and coordination of care on day of encounter:  *** min  Kai Levins, MD

## 2022-01-16 NOTE — Therapy (Signed)
OUTPATIENT PHYSICAL THERAPY CERVICAL EVALUATION   Patient Name: [name] [name] MRN: [id number] DOB:1975/05/07, 46 y.o., female Today's Date: 01/16/2022   PT End of Session - 01/16/22 1108     Visit Number 1    Number of Visits 13   Plus eval   Date for PT Re-Evaluation 03/13/22   Due to delay in scheduling   Authorization Type [place] [employer]    PT Start Time 1105   Pt arrived late   PT Stop Time 1145    PT Time Calculation (min) 40 min    Activity Tolerance Patient tolerated treatment well    Behavior During Therapy [facility] for tasks assessed/performed             Past Medical History:  Diagnosis Date   Anemia    Arthritis    Past Surgical History:  Procedure Laterality Date   CESAREAN SECTION     Patient Active Problem List   Diagnosis Date Noted   Neck pain 03/15/2016   Muscle strain 03/15/2016    PCP: None on file  REFERRING PROVIDER: Shellia Carwin, MD   REFERRING DIAG: M54.2 (ICD-10-CM) - Neck pain M54.2 (ICD-10-CM) - Cervicalgia G43.709 (ICD-10-CM) - Chronic migraine without aura without status migrainosus, not intractable G44.86 (ICD-10-CM) - Cervicogenic headache   THERAPY DIAG:  Cervicalgia  Abnormal posture  Muscle weakness (generalized)  Rationale for Evaluation and Treatment Rehabilitation  ONSET DATE: 12/19/2021 (referral)  SUBJECTIVE:                                                                                                                                                                                                         SUBJECTIVE STATEMENT: "I get a lot of swelling in my neck, but that is not the pain that bothers me". Pt proceeds to point to her mid-thoracic region on R side and state that it constantly hurts and really bothers her. States the nortriptyline makes her very groggy and depressed, but it does help her pain. Pt denies dizziness/lightheadedness if she takes her iron supplements, as she is anemic.   PERTINENT  HISTORY:  patient has reduced range of motion of her neck, particularly when turning her head to the right. She has significant cervical paraspinal tenderness as well   PAIN:  Are you having pain? Yes: NPRS scale: 7/10 Pain location: R thoracic region Pain description: "t's stuck", achy Aggravating factors: Reaching overhead, bending at work  Relieving factors: Placing heat on affected area (pt uses hot hands)   PRECAUTIONS: None  WEIGHT BEARING RESTRICTIONS No  FALLS:  Has patient fallen in last 6 months? No  LIVING ENVIRONMENT: Lives with: lives with their spouse and lives with their son Lives in: House/apartment Stairs: Yes: External: 3 steps; none Has following equipment at home: None  OCCUPATION: Interior and spatial designer   PLOF: Independent  PATIENT GOALS "To get better"   OBJECTIVE:    PATIENT SURVEYS:  NDI 17/50 (moderate disability)   COGNITION: Overall cognitive status: Within functional limits for tasks assessed   SENSATION: WFL  POSTURE: rounded shoulders, forward head, increased thoracic kyphosis, and posterior pelvic tilt  PALPATION: Trigger points noted in R upper and middle trapezius as well as paraspinals bilaterally below C4    CERVICAL ROM:   Active ROM AROM (deg) eval  Flexion 44  Extension 35  Right lateral flexion 22  Left lateral flexion 31  Right rotation 37  Left rotation 48   (Blank rows = not tested)  UPPER EXTREMITY ROM: All WNL - tested in seated position   Active ROM Right eval Left eval  Shoulder flexion    Shoulder extension    Shoulder abduction    Shoulder adduction    Shoulder extension    Shoulder internal rotation    Shoulder external rotation    Elbow flexion    Elbow extension    Wrist flexion    Wrist extension    Wrist ulnar deviation    Wrist radial deviation    Wrist pronation    Wrist supination     (Blank rows = not tested)  UPPER EXTREMITY MMT/CLEARING: ALL WNL- TESTED IN SEATED  POSITION   MMT Right eval Left eval  Shoulder flexion    Shoulder extension    Shoulder abduction    Shoulder adduction    Shoulder extension    Shoulder internal rotation    Shoulder external rotation    Middle trapezius    Lower trapezius    Elbow flexion    Elbow extension    Wrist flexion    Wrist extension    Wrist ulnar deviation    Wrist radial deviation    Wrist pronation    Wrist supination    Grip strength     (Blank rows = not tested)   TODAY'S TREATMENT:  Next Session    PATIENT EDUCATION:  Education details: POC, eval findings, initial HEP Person educated: Patient Education method: Medical illustrator Education comprehension: verbalized understanding   HOME EXERCISE PROGRAM: Access Code: [email or web address] URL: [email or web address] Date: 01/16/2022 Prepared by: Alethia Berthold Nichalos Brenton  Exercises - Seated Cervical Sidebending Stretch  - 1 x daily - 7 x weekly - 3 sets - 10 reps  ASSESSMENT:  CLINICAL IMPRESSION: Patient is a 46 year old female referred to Neuro OPPT for cervicalgia. Pt's PMH is significant for: chronic back pain, migraines, current smoker. The following deficits were present during the exam: decreased cervical ROM and referred pain to thoracic spine. Pt would benefit from skilled PT to address these impairments and functional limitations to reduce pain levels, improve QOL and meet requirements for her occupation.   OBJECTIVE IMPAIRMENTS decreased activity tolerance, decreased mobility, hypomobility, impaired flexibility, and pain.   ACTIVITY LIMITATIONS carrying, lifting, reach over head, and caring for others  PARTICIPATION LIMITATIONS: shopping, community activity, occupation, and yard work  PERSONAL FACTORS Behavior pattern, Fitness, Past/current experiences, Profession, and 1 comorbidity: job requirements  are also affecting patient's functional outcome.   REHAB POTENTIAL: Good  CLINICAL DECISION MAKING:  Stable/uncomplicated  EVALUATION COMPLEXITY: Low   GOALS: Goals reviewed with patient? Yes  SHORT TERM GOALS: Target date: 02/06/2022   Pt will be independent with initial HEP for improved cervical ROM and decreased pain.   Baseline:  Goal status: INITIAL  2.  Pt will improve cervical flexion and extension by 10 degrees each in seated position for improved functional use of spine and pain modulation  Baseline: 44 degrees flexion, 35 degrees extension  Goal status: INITIAL  3.  Pt will consistently report <6/10 pain in cervical and thoracic regions for improved QOL and pain modulation Baseline: >8/10  Goal status: INITIAL   LONG TERM GOALS: Target date: 02/27/2022  Pt will consistently report <4/10 pain in cervical and thoracic regions for improved QOL and pain modulation Baseline: >8/10 Goal status: INITIAL  2.  Pt will be independent with final HEP for improved strength, balance, transfers and gait.  Baseline:  Goal status: INITIAL  3.  Pt will improve R cervical rotation and lateral flexion by 15 degrees each for improved functional use of spine Baseline: 37 degrees rotation, 22 degrees lateral flexion  Goal status: INITIAL  4.  Pt will improve NDI to </= 10/50 for reduced pain levels and improved self-perceived functional ability  Baseline: 17/50 (moderate disability) Goal status: INITIAL    PLAN: PT FREQUENCY: 2x/week  PT DURATION: 6 weeks  PLANNED INTERVENTIONS: Therapeutic exercises, Therapeutic activity, Neuromuscular re-education, Balance training, Gait training, Patient/Family education, Self Care, Joint mobilization, Joint manipulation, Vestibular training, Canalith repositioning, Dry Needling, Spinal mobilization, Cryotherapy, Moist heat, Taping, Manual therapy, and Re-evaluation  PLAN FOR NEXT SESSION: Add to HEP for paraspinal/periscapular strength/ROM, scifit, manual therapy (suboccipital release, manual stretching)   [employer] Authorization    Choose one: Neuro Rehabilitative  Standardized Assessment or Functional Outcome Tool: See Pain Assessment and NDI  Score or Percent Disability: NDI- 17/50 = 34% (moderate disability)   Body Parts Treated (Select each separately):  Cervicothoracic. Overall deficits/functional limitations for body part selected: moderate Shoulder. Overall deficits/functional limitations for body part selected: moderate   Dashun Borre E Arwen Haseley, PT, DPT  01/16/2022, 11:52 AM

## 2022-01-17 ENCOUNTER — Encounter (HOSPITAL_COMMUNITY): Payer: Self-pay | Admitting: Emergency Medicine

## 2022-01-17 ENCOUNTER — Emergency Department (HOSPITAL_COMMUNITY)
Admission: EM | Admit: 2022-01-17 | Discharge: 2022-01-17 | Discharge status: Against Medical Advice | Payer: Medicaid Other | Attending: Emergency Medicine | Admitting: Emergency Medicine

## 2022-01-17 DIAGNOSIS — Z5321 Procedure and treatment not carried out due to patient leaving prior to being seen by health care provider: Secondary | ICD-10-CM | POA: Insufficient documentation

## 2022-01-17 DIAGNOSIS — R112 Nausea with vomiting, unspecified: Secondary | ICD-10-CM | POA: Insufficient documentation

## 2022-01-17 DIAGNOSIS — R519 Headache, unspecified: Secondary | ICD-10-CM | POA: Insufficient documentation

## 2022-01-17 DIAGNOSIS — M542 Cervicalgia: Secondary | ICD-10-CM | POA: Diagnosis not present

## 2022-01-17 LAB — COMPREHENSIVE METABOLIC PANEL
ALT: 12 U/L (ref 0–44)
AST: 16 U/L (ref 15–41)
Albumin: 3.9 g/dL (ref 3.5–5.0)
Alkaline Phosphatase: 60 U/L (ref 38–126)
Anion gap: 9 (ref 5–15)
BUN: 9 mg/dL (ref 6–20)
CO2: 25 mmol/L (ref 22–32)
Calcium: 9.1 mg/dL (ref 8.9–10.3)
Chloride: 105 mmol/L (ref 98–111)
Creatinine, Ser: 0.83 mg/dL (ref 0.44–1.00)
GFR, Estimated: >60 mL/min (ref >60)
Glucose, Bld: 110 mg/dL — ABNORMAL HIGH (ref 70–99)
Potassium: 4 mmol/L (ref 3.5–5.1)
Sodium: 139 mmol/L (ref 135–145)
Total Bilirubin: 0.5 mg/dL (ref 0.3–1.2)
Total Protein: 7.4 g/dL (ref 6.5–8.1)

## 2022-01-17 LAB — URINALYSIS, ROUTINE W REFLEX MICROSCOPIC
Bilirubin Urine: NEGATIVE
Glucose, UA: NEGATIVE mg/dL
Hgb urine dipstick: NEGATIVE
Ketones, ur: NEGATIVE mg/dL
Leukocytes,Ua: NEGATIVE
Nitrite: NEGATIVE
Protein, ur: NEGATIVE mg/dL
Specific Gravity, Urine: 1.013 (ref 1.005–1.030)
pH: 8 (ref 5.0–8.0)

## 2022-01-17 LAB — CBC
HCT: 39.2 % (ref 36.0–46.0)
Hemoglobin: 12.3 g/dL (ref 12.0–15.0)
MCH: 26.7 pg (ref 26.0–34.0)
MCHC: 31.4 g/dL (ref 30.0–36.0)
MCV: 85.2 fL (ref 80.0–100.0)
Platelets: 300 10*3/uL (ref 150–400)
RBC: 4.6 MIL/uL (ref 3.87–5.11)
RDW: 19 % — ABNORMAL HIGH (ref 11.5–15.5)
WBC: 10.2 10*3/uL (ref 4.0–10.5)
nRBC: 0 % (ref 0.0–0.2)

## 2022-01-17 LAB — I-STAT BETA HCG BLOOD, ED (MC, WL, AP ONLY): I-stat hCG, quantitative: <5 m[IU]/mL (ref <5)

## 2022-01-17 LAB — LIPASE, BLOOD: Lipase: 30 U/L (ref 11–51)

## 2022-01-17 MED ORDER — ONDANSETRON 4 MG PO TBDP
4.0000 mg | ORAL_TABLET | Freq: Once | ORAL | Status: AC
  Administered 2022-01-17: 4 mg via ORAL
  Filled 2022-01-17: qty 1

## 2022-01-17 NOTE — ED Notes (Signed)
Called pt x3 for vitals, no response. This NT checked the whole lobby vitals.

## 2022-01-17 NOTE — ED Triage Notes (Signed)
Pt reports nausea since yesterday that has triggered her migraines. Endorses head pressure, n/v.

## 2022-01-17 NOTE — ED Provider Triage Note (Signed)
Emergency Medicine Provider Triage Evaluation Note  [name] [name] , a 46 y.o. female  was evaluated in triage.  Pt complains of headache and neck pain.  History of migraines, was at physical therapy to assist with neck pain and started feeling very nauseous.  After getting to vomit, she started getting a pressure sensation in the back of her head that is throbbing.  States that it feels worse than her normal migraines because of the throbbing sensation.  She is on nortriptyline for migraines, last taken last night.  No other symptoms reported.  Review of Systems  Positive: Migraine, neck pain, nausea, vomiting Negative: Fever, chills  Physical Exam  BP (!) 164/98 (BP Location: Right Arm)   Pulse 72   Temp 98.4 F (36.9 C) (Oral)   Resp 16   Ht 5\' 2"  (1.575 m)   Wt 101 kg   LMP 01/09/2022   SpO2 99%   BMI 40.73 kg/m  Gen:   Awake, no distress   Resp:  Normal effort  MSK:   Moves extremities without difficulty  Other:    Medical Decision Making  Medically screening exam initiated at 12:51 PM.  Appropriate orders placed.  [name] [name] was informed that the remainder of the evaluation will be completed by another provider, this initial triage assessment does not replace that evaluation, and the importance of remaining in the ED until their evaluation is complete.  Workup initiated   [name] [name] T, PA-C 01/17/22 1252

## 2022-01-23 ENCOUNTER — Ambulatory Visit: Payer: Medicaid Other | Admitting: Neurology

## 2022-01-25 ENCOUNTER — Ambulatory Visit: Payer: Medicaid Other | Admitting: Physical Therapy

## 2022-01-30 ENCOUNTER — Ambulatory Visit: Payer: Medicaid Other | Attending: Neurology | Admitting: Physical Therapy

## 2022-01-30 ENCOUNTER — Encounter: Payer: Self-pay | Admitting: Physical Therapy

## 2022-01-30 NOTE — Therapy (Signed)
[facility] [address] [place], [place], [address] Phone: [phone]   Fax:  [phone]  Patient Details  Name: Ednamae Schiano MRN: [id number] Date of Birth: 08-26-1975 Referring Provider:  No ref. provider found  Encounter Date: 01/30/2022  Pt no-showed her second scheduled PT appointment today at [facility] and left VM reminding her of no show policy and informing her if she did not show to next PT appointment, she will be DC from therapy. Also reminded her of next scheduled appointment time.    Cruzita Lederer Channing Savich, PT, DPT 01/30/2022, 8:26 AM  [facility] [address] [place], [place], [address] Phone: [phone]   Fax:  [phone]

## 2022-01-31 NOTE — Progress Notes (Deleted)
NEUROLOGY FOLLOW UP OFFICE NOTE  [name] [name] [id number]  Subjective:  [name] [name] is a 46 y.o. year old female with a history of chronic back pain, migraines, current smoker who we last saw on 12/19/21.  To briefly review: 12/19/21: Patient was seen at [facility] by Sherron Flemings on 10/23/21. Patient mentioned that she had headaches since 2002. She was prescribed extra strength excedrin for headaches and robaxin for chronic back pain.   Onset: at least by 2002 Quality: Pressure and pounding sensations Intensity: "11/10" Location: Neck, top of head or left face or whole face Duration: few hours to days Frequency: 15+ days Associated symptoms: occasional nausea and vomiting; neck and shoulder stiffness and tightness; occasional photophobia and phonophobia Aura: No Activity: Has to lay down and stop what she is doing. Will put a heating pad on head Aggravating factors: Flexing neck Relieving factors: Heating pad, taking excedrin   Current abortive medications: Excedrin (helps get rid of her headaches, pretty instantly; prevents nausea and vomiting); mobic and robaxin for back/neck pain Current prophylactic medications: None   Past abortive or prophylactic medications: May have had many years ago, but patient cannot recall and did not recognize names of commonly used medications. She has tried ibuprofen and tylenol in the past that did not help.   Frequency of medications: Takes excedrin every day (once every day, twice in a day if she has a headache) Family history: No Smoker: Yes Alcohol: No Caffeine: 1-2 cans per week Sleep: "Sleep too much", fatigue Mood: Been down, lost Mom in April 2023   Assessment: Symptoms are most consistent with chronic migraines without aura. There is certainly a cervicogenic element to her headaches as well. She is currently controlled with Excedrin but still having at least 15 days of headaches per month. She is not currently  on a preventative medication.   PLAN: Refer to physical therapy for neck pain Start nortriptyline 10mg  at bedtime for migraine prevention Take Excedrin as needed for migraine attack and nausea Limit use of pain relievers to no more than 2 days out of week to prevent risk of rebound or medication-overuse headache. Consider Magnesium and Riboflavin Supplementation Headache lifestyle recommendations given   -Continue Vit D supplementation 50000 IU weekly for vit D deficiency   -Return to clinic in 1 month   Since their last visit: ***  MEDICATIONS:  Outpatient Encounter Medications as of 02/06/2022  Medication Sig   acetaminophen (TYLENOL) 325 MG tablet Take 1 tablet (325 mg total) by mouth every 6 (six) hours as needed. (Patient not taking: Reported on 12/19/2021)   albuterol (VENTOLIN HFA) 108 (90 Base) MCG/ACT inhaler Inhale 1-2 puffs into the lungs every 6 (six) hours as needed for wheezing or shortness of breath.   aspirin-acetaminophen-caffeine (EXCEDRIN MIGRAINE) 250-250-65 MG tablet Take 1 tablet by mouth every 6 (six) hours as needed for headache.   cyclobenzaprine (FLEXERIL) 10 MG tablet Take 1 tablet (10 mg total) by mouth 2 (two) times daily as needed. (Patient not taking: Reported on 12/19/2021)   diclofenac (VOLTAREN) 75 MG EC tablet Take 1 tablet (75 mg total) by mouth 2 (two) times daily. (Patient not taking: Reported on 12/19/2021)   Ipratropium-Albuterol (COMBIVENT RESPIMAT) 20-100 MCG/ACT AERS respimat Inhale 1 puff into the lungs every 6 (six) hours.   meloxicam (MOBIC) 7.5 MG tablet Take 7.5 mg by mouth daily.   methocarbamol (ROBAXIN) 500 MG tablet Take 1 tablet (500 mg total) by mouth 4 (four) times daily.  nortriptyline (PAMELOR) 10 MG capsule Take 1 capsule (10 mg total) by mouth at bedtime.   No facility-administered encounter medications on file as of 02/06/2022.    PAST MEDICAL HISTORY: Past Medical History:  Diagnosis Date   Anemia    Arthritis     PAST  SURGICAL HISTORY: Past Surgical History:  Procedure Laterality Date   CESAREAN SECTION      ALLERGIES: No Known Allergies  FAMILY HISTORY: Family History  Problem Relation Age of Onset   High blood pressure Mother    High blood pressure Father     SOCIAL HISTORY: Social History   Tobacco Use   Smoking status: Every Day    Packs/day: 0.20    Types: Cigars, Cigarettes   Smokeless tobacco: Never   Tobacco comments:    3 cigars a day  Vaping Use   Vaping Use: Never used  Substance Use Topics   Alcohol use: No   Drug use: No   Social History   Social History Narrative   Rt handed   Caffeine 1-2 can a week   Live in a one story home      Objective:  Vital Signs:  LMP 01/09/2022   ***  Labs and Imaging review: No new testing reviewed.   Prior testing: External labs: HbA1c (10/23/21): 5.6 CBC significant for anemia (Hb 9.7) CMP unremarkable TSH 1.430 Vit D 4.6    Imaging: CT head wo contrast (07/05/17): FINDINGS: Brain: The brain shows a normal appearance without evidence of malformation, atrophy, old or acute small or large vessel infarction, mass lesion, hemorrhage, hydrocephalus or extra-axial collection. Slight asymmetry of the lateral ventricles is a normal variant.   Vascular: No hyperdense vessel. No evidence of atherosclerotic calcification.   Skull: Normal.  No traumatic finding.  No focal bone lesion.   Sinuses/Orbits: Sinuses are clear. Orbits appear normal. Mastoids are clear.   Other: None significant   IMPRESSION: Normal head CT   Cervical spine xray (01/18/17): FINDINGS: Normal alignment. Negative for fracture. Disc degeneration and spurring on the right at C5-6 and C6-7 with moderate foraminal stenosis. This is unchanged from the prior study. Mild left foraminal narrowing at C4-5 and C5-6 and C6-7. Prevertebral soft tissues normal.   IMPRESSION: Disc degeneration and spurring right greater than left. No  acute abnormality.   CT cervical spine wo contrast (07/02/14): FINDINGS: C1 through the cervicothoracic junction is visualized in its entirety. There is no evidence of cervical spine fracture or prevertebral soft tissue swelling. Alignment is normal with the exception of loss of the normal cervical lordosis centered at C5 which could be positional. No other significant bone abnormalities are identified. Mild disc degenerative change is noted at C5-C6 with minimal if any mass effect upon the right neural foramen at that level. Airways patent and midline.   IMPRESSION: No acute abnormality.  Mild C5-C6 disc degenerative change.  Assessment/Plan:  This is [name] [name], a 46 y.o. female with:  Chronic migraines without aura***   Plan: ***  Return to clinic in ***  Total time spent reviewing records, interview, history/exam, documentation, and coordination of care on day of encounter:  *** min  Kai Levins, MD

## 2022-02-01 ENCOUNTER — Ambulatory Visit: Payer: Medicaid Other

## 2022-02-01 NOTE — Therapy (Unsigned)
[facility] [address] [place], [place], [address] Phone: [phone]   Fax:  [phone]  Patient Details  Name: [name] [name] MRN: [id number] Date of Birth: [birth year] Referring Provider:  No ref. provider found  Encounter Date: 02/01/2022  PHYSICAL THERAPY DISCHARGE SUMMARY  Visits from Start of Care: 1  Current functional level related to goals / functional outcomes: Patient has not returned since evaluation   Remaining deficits: See evaluation   Education / Equipment: Exam findings, no-show policy   Patient agrees to discharge. Patient goals were  unable to be assessed as patient did not return since evaluation . Patient is being discharged due to not returning since the last visit.   Debbora Dus, PT Debbora Dus, PT, DPT, CBIS  02/01/2022, 8:19 AM  [facility] [address] [place], [place], [address] Phone: [phone]   Fax:  [phone]

## 2022-02-06 ENCOUNTER — Encounter: Payer: Self-pay | Admitting: Neurology

## 2022-02-06 ENCOUNTER — Ambulatory Visit: Payer: Medicaid Other | Admitting: Neurology

## 2022-02-06 ENCOUNTER — Ambulatory Visit: Payer: Medicaid Other

## 2022-02-06 DIAGNOSIS — Z029 Encounter for administrative examinations, unspecified: Secondary | ICD-10-CM

## 2022-02-08 ENCOUNTER — Ambulatory Visit: Payer: Medicaid Other

## 2022-02-13 ENCOUNTER — Ambulatory Visit: Payer: Medicaid Other | Admitting: Physical Therapy

## 2022-02-15 ENCOUNTER — Ambulatory Visit: Payer: Medicaid Other

## 2022-02-20 ENCOUNTER — Ambulatory Visit: Payer: Medicaid Other

## 2022-02-27 ENCOUNTER — Ambulatory Visit: Payer: Medicaid Other | Admitting: Physical Therapy

## 2022-03-01 ENCOUNTER — Ambulatory Visit: Payer: Medicaid Other | Admitting: Physical Therapy

## 2022-08-06 ENCOUNTER — Encounter (HOSPITAL_COMMUNITY): Payer: Self-pay

## 2022-08-06 ENCOUNTER — Other Ambulatory Visit: Payer: Self-pay

## 2022-08-06 ENCOUNTER — Emergency Department (HOSPITAL_COMMUNITY)
Admission: EM | Admit: 2022-08-06 | Discharge: 2022-08-06 | Disposition: A | Discharge status: Home / Self Care | Payer: Medicaid Other | Attending: Emergency Medicine | Admitting: Emergency Medicine

## 2022-08-06 DIAGNOSIS — N939 Abnormal uterine and vaginal bleeding, unspecified: Secondary | ICD-10-CM | POA: Insufficient documentation

## 2022-08-06 LAB — COMPREHENSIVE METABOLIC PANEL
ALT: 12 U/L (ref 0–44)
AST: 14 U/L — ABNORMAL LOW (ref 15–41)
Albumin: 3.7 g/dL (ref 3.5–5.0)
Alkaline Phosphatase: 53 U/L (ref 38–126)
Anion gap: 5 (ref 5–15)
BUN: 13 mg/dL (ref 6–20)
CO2: 25 mmol/L (ref 22–32)
Calcium: 8.5 mg/dL — ABNORMAL LOW (ref 8.9–10.3)
Chloride: 108 mmol/L (ref 98–111)
Creatinine, Ser: 0.75 mg/dL (ref 0.44–1.00)
GFR, Estimated: >60 mL/min (ref >60)
Glucose, Bld: 95 mg/dL (ref 70–99)
Potassium: 3.6 mmol/L (ref 3.5–5.1)
Sodium: 138 mmol/L (ref 135–145)
Total Bilirubin: 0.5 mg/dL (ref 0.3–1.2)
Total Protein: 7.2 g/dL (ref 6.5–8.1)

## 2022-08-06 LAB — CBC
HCT: 36.2 % (ref 36.0–46.0)
Hemoglobin: 11.4 g/dL — ABNORMAL LOW (ref 12.0–15.0)
MCH: 27.9 pg (ref 26.0–34.0)
MCHC: 31.5 g/dL (ref 30.0–36.0)
MCV: 88.5 fL (ref 80.0–100.0)
Platelets: 307 10*3/uL (ref 150–400)
RBC: 4.09 MIL/uL (ref 3.87–5.11)
RDW: 17.9 % — ABNORMAL HIGH (ref 11.5–15.5)
WBC: 7.2 10*3/uL (ref 4.0–10.5)
nRBC: 0 % (ref 0.0–0.2)

## 2022-08-06 LAB — I-STAT BETA HCG BLOOD, ED (MC, WL, AP ONLY): I-stat hCG, quantitative: <5 m[IU]/mL (ref <5)

## 2022-08-06 NOTE — ED Triage Notes (Addendum)
Patient is on her period and said she had a large clot. Has never had a clot that big before since her miscarriage in 2014. Denies vaginal pain but does have lower back pain. Not on a blood thinner.

## 2022-08-06 NOTE — Discharge Instructions (Addendum)
Please take tylenol/ibuprofen for pain. I recommend close follow-up with OB-GYN or PCP for reevaluation.  Please do not hesitate to return to emergency department if worrisome signs symptoms we discussed become apparent.

## 2022-08-06 NOTE — ED Provider Notes (Signed)
[facility] AT [facility] Provider Note   CSN: [id number] Arrival date & time: 08/06/22  0759     History  Chief Complaint  Patient presents with   Vaginal Bleeding    [name] [name] is a 47 y.o. female past medical history of anemia presents today for evaluation of vaginal bleeding.  Patient reports menstrual cycle that started yesterday and she has been passing large blood clots.  LMP was around 04/09. States he menstrual cycles normally last for 5 days. She reports lower abdominal pain.  She denies any fever, nausea or vomiting.  Denies any urinary symptoms.  She is sexually active, denies any history of STI.  History of miscarriage in 2014 at 20 weeks s/p D&C. She denies taking any blood thinners.   Vaginal Bleeding     Past Medical History:  Diagnosis Date   Anemia    Arthritis    Past Surgical History:  Procedure Laterality Date   CESAREAN SECTION       Home Medications Prior to Admission medications   Medication Sig Start Date End Date Taking? Authorizing Provider  acetaminophen (TYLENOL) 325 MG tablet Take 1 tablet (325 mg total) by mouth every 6 (six) hours as needed. Patient not taking: Reported on 12/19/2021 10/08/17   Harlene Salts A, PA-C  albuterol (VENTOLIN HFA) 108 (90 Base) MCG/ACT inhaler Inhale 1-2 puffs into the lungs every 6 (six) hours as needed for wheezing or shortness of breath. 09/04/20   Wallis Bamberg, PA-C  aspirin-acetaminophen-caffeine (EXCEDRIN MIGRAINE) [phone] MG tablet Take 1 tablet by mouth every 6 (six) hours as needed for headache.    [provider]  cyclobenzaprine (FLEXERIL) 10 MG tablet Take 1 tablet (10 mg total) by mouth 2 (two) times daily as needed. Patient not taking: Reported on 12/19/2021 02/23/18   Michela Pitcher A, PA-C  diclofenac (VOLTAREN) 75 MG EC tablet Take 1 tablet (75 mg total) by mouth 2 (two) times daily. Patient not taking: Reported on 12/19/2021 09/27/20   Elson Areas, PA-C   Ipratropium-Albuterol (COMBIVENT RESPIMAT) 20-100 MCG/ACT AERS respimat Inhale 1 puff into the lungs every 6 (six) hours. 09/04/20   Wallis Bamberg, PA-C  meloxicam (MOBIC) 7.5 MG tablet Take 7.5 mg by mouth daily. 11/06/21   [provider]  methocarbamol (ROBAXIN) 500 MG tablet Take 1 tablet (500 mg total) by mouth 4 (four) times daily. 09/27/20   Elson Areas, PA-C  nortriptyline (PAMELOR) 10 MG capsule Take 1 capsule (10 mg total) by mouth at bedtime. 12/19/21 03/19/22  Antony Madura, MD      Allergies    Patient has no known allergies.    Review of Systems   Review of Systems  Genitourinary:  Positive for vaginal bleeding.    Physical Exam Updated Vital Signs BP (!) 173/98 (BP Location: Left Arm)   Pulse 89   Temp 98.7 F (37.1 C) (Oral)   Resp 16   Ht 5\' 1"  (1.549 m)   Wt 98 kg   SpO2 100%   BMI 40.81 kg/m  Physical Exam Vitals and nursing note reviewed.  Constitutional:      Appearance: Normal appearance.  HENT:     Head: Normocephalic and atraumatic.     Mouth/Throat:     Mouth: Mucous membranes are moist.  Eyes:     General: No scleral icterus. Cardiovascular:     Rate and Rhythm: Normal rate and regular rhythm.     Pulses: Normal pulses.  Heart sounds: Normal heart sounds.  Pulmonary:     Effort: Pulmonary effort is normal.     Breath sounds: Normal breath sounds.  Abdominal:     General: Abdomen is flat.     Palpations: Abdomen is soft.     Tenderness: There is no abdominal tenderness.  Genitourinary:    Comments: Pelvic exam with RN at bedside throughout the exam. External: Normal Vaginal: no evidence of trauma, laceration. No blood clots visualized. Minimal amount of bleeding. Bimanual: Negative cervical motion tenderness. Musculoskeletal:        General: No deformity.  Skin:    General: Skin is warm.     Findings: No rash.  Neurological:     General: No focal deficit present.     Mental Status: She is alert.  Psychiatric:        Mood  and Affect: Mood normal.     ED Results / Procedures / Treatments   Labs (all labs ordered are listed, but only abnormal results are displayed) Labs Reviewed  CBC  COMPREHENSIVE METABOLIC PANEL  I-STAT BETA HCG BLOOD, ED ([facility], [facility], [facility] ONLY)    EKG None  Radiology No results found.  Procedures Procedures    Medications Ordered in ED Medications - No data to display  ED Course/ Medical Decision Making/ A&P                             Medical Decision Making Amount and/or Complexity of Data Reviewed Labs: ordered.   This patient presents to the ED for vaginal bleeding, this involves an extensive number of treatment options, and is a complaint that carries with a high risk of complications and morbidity.  The differential diagnosis includes fibroids, coagulopathy, trauma, polyps, malignancy.  This is not an exhaustive list.  Lab tests: I ordered and personally interpreted labs.  The pertinent results include: WBC unremarkable. Hbg unremarkable. Platelets unremarkable. Electrolytes unremarkable. BUN, creatinine unremarkable. Hcg negative.  Problem list/ ED course/ Critical interventions/ Medical management: HPI: See above Vital signs within normal range and stable throughout visit. Laboratory/imaging studies significant for: See above. On physical examination, patient is afebrile and appears in no acute distress. Patient presents with vaginal bleeding likely secondary to fibroids or other non-emergent cause of abnormal uterine bleeding. Based on History, Exam, and ED Workup patient's presentation not consistent with ectopic pregnancy, molar pregnancy, life-threatening coagulopathy, trauma, serious bacterial infection. Advised patient to take Tylenol/ibuprofen/naproxen for pain/spasm, follow-up with OBGYN or primary care physician for further evaluation and management, return to the ER if new or worsening symptoms.   I have reviewed the patient home medicines and have made  adjustments as needed.  Cardiac monitoring/EKG: The patient was maintained on a cardiac monitor.  I personally reviewed and interpreted the cardiac monitor which showed an underlying rhythm of: sinus rhythm.  Additional history obtained: External records from outside source obtained and reviewed including: Chart review including previous notes, labs, imaging.  Consultations obtained:  Disposition Continued outpatient therapy. Follow-up with OBGYN recommended for reevaluation of symptoms. Treatment plan discussed with patient.  Pt acknowledged understanding was agreeable to the plan. Worrisome signs and symptoms were discussed with patient, and patient acknowledged understanding to return to the ED if they noticed these signs and symptoms. Patient was stable upon discharge.   This chart was dictated using voice recognition software.  Despite best efforts to proofread,  errors can occur which can change the documentation meaning.  Final Clinical Impression(s) / ED Diagnoses Final diagnoses:  Vaginal bleeding    Rx / DC Orders ED Discharge Orders     None         Jeanelle Malling, [place] 08/06/22 1013    [name], MD 08/06/22 1413

## 2022-09-11 ENCOUNTER — Encounter (HOSPITAL_COMMUNITY): Payer: Self-pay | Admitting: *Deleted

## 2022-09-11 ENCOUNTER — Ambulatory Visit (HOSPITAL_COMMUNITY)
Admission: EM | Admit: 2022-09-11 | Discharge: 2022-09-11 | Disposition: A | Discharge status: Home / Self Care | Payer: Medicaid Other

## 2022-09-11 DIAGNOSIS — G5612 Other lesions of median nerve, left upper limb: Secondary | ICD-10-CM | POA: Diagnosis not present

## 2022-09-11 DIAGNOSIS — M79602 Pain in left arm: Secondary | ICD-10-CM

## 2022-09-11 MED ORDER — PREDNISONE 20 MG PO TABS: 40.0000 mg | ORAL_TABLET | Freq: Every day | ORAL | 0 refills | Status: AC

## 2022-09-11 NOTE — ED Triage Notes (Signed)
Pt states she has had left hand pain that radiates up her arm into her shoulder x 3 days. She does take mobic and robaxin without relief.

## 2022-09-11 NOTE — Discharge Instructions (Addendum)
I am concerned you may be developing carpal tunnel syndrome which is caused by inflammation and dysfunction of one of the nerves in your forearm/wrist.   Take prednisone 40mg  once daily for the next 5 days with food to reduce inflammation and pain.  Apply ice 20 minutes on 20 minutes off as needed.   Schedule an appointment with orthopedic provider as needed for follow-up evaluation.  Wear the wrist brace provided

## 2022-09-11 NOTE — ED Provider Notes (Signed)
[facility]    CSN: [id number] Arrival date & time: 09/11/22  1313      History   Chief Complaint Chief Complaint  Patient presents with   Arm Pain    HPI [name] [name] is a 47 y.o. female.   Patient presents to urgent care for evaluation of pain to the left hand that radiates proximally up to the shoulder that started 3 days ago. Symptoms have worsened gradually since once. States pain comes and goes and is worse when she is lifting heavy objects or moving the left arm. Massaging the arm helps a little bit with the tenderness. Denies chest pain, shortness of breath, fevers, and rash. States she tried to use heat therapy to the arm yesterday but this did not help very much. Taking mobic and robaxin and this isn't helping either. No personal history of carpal tunnel disease but states her mom has trouble with carpal tunnel. This has never happened before to either arm. No recent trauma/injuries to the left wrist. 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   Arm Pain    Past Medical History:  Diagnosis Date   Anemia    Arthritis     Patient Active Problem List   Diagnosis Date Noted   Neck pain 03/15/2016   Muscle strain 03/15/2016    Past Surgical History:  Procedure Laterality Date   CESAREAN SECTION      OB History   No obstetric history on file.      Home Medications    Prior to Admission medications   Medication Sig Start Date End Date Taking? Authorizing Provider  ferrous sulfate 324 (65 Fe) MG TBEC Take by mouth. 01/22/22  Yes [provider]  meloxicam (MOBIC) 7.5 MG tablet Take 7.5 mg by mouth daily. 11/06/21  Yes [provider]  methocarbamol (ROBAXIN) 500 MG tablet Take 1 tablet (500 mg total) by mouth 4 (four) times daily. 09/27/20  Yes Cheron Schaumann K, PA-C  nortriptyline (PAMELOR) 10 MG capsule Take 1 capsule (10 mg total) by mouth at bedtime. 12/19/21 09/11/22  Yes Antony Madura, MD  predniSONE (DELTASONE) 20 MG tablet Take 2 tablets (40 mg total) by mouth daily for 5 days. 09/11/22 09/16/22 Yes StanhopeDonavan Burnet, FNP  acetaminophen (TYLENOL) 325 MG tablet Take 1 tablet (325 mg total) by mouth every 6 (six) hours as needed. Patient not taking: Reported on 12/19/2021 10/08/17   Harlene Salts A, PA-C  albuterol (VENTOLIN HFA) 108 (90 Base) MCG/ACT inhaler Inhale 1-2 puffs into the lungs every 6 (six) hours as needed for wheezing or shortness of breath. 09/04/20   Wallis Bamberg, PA-C  aspirin-acetaminophen-caffeine (EXCEDRIN MIGRAINE) [phone] MG tablet Take 1 tablet by mouth every 6 (six) hours as needed for headache.    [provider]  cyclobenzaprine (FLEXERIL) 10 MG tablet Take 1 tablet (10 mg total) by mouth 2 (two) times daily as needed. Patient not taking: Reported on 12/19/2021 02/23/18   Michela Pitcher A, PA-C  diclofenac (VOLTAREN) 75 MG EC tablet Take 1 tablet (75 mg total) by mouth 2 (two) times daily. Patient not taking: Reported on 12/19/2021 09/27/20   Elson Areas, PA-C  Ipratropium-Albuterol (COMBIVENT RESPIMAT) 20-100 MCG/ACT AERS respimat Inhale 1 puff into the lungs every 6 (six) hours. 09/04/20   Wallis Bamberg, PA-C  Vitamin D, Ergocalciferol, (DRISDOL) 1.25 MG (50000 UNIT) CAPS capsule Take 50,000 Units by mouth once a week. 09/05/22  [provider]    Family History Family History  Problem Relation Age of Onset   High blood pressure Mother    High blood pressure Father     Social History Social History   Tobacco Use   Smoking status: Every Day    Packs/day: .2    Types: Cigars, Cigarettes   Smokeless tobacco: Never   Tobacco comments:    3 cigars a day  Vaping Use   Vaping Use: Never used  Substance Use Topics   Alcohol use: No   Drug use: No     Allergies   Patient has no known allergies.   Review of Systems Review of Systems Per HPI  Physical Exam Triage Vital Signs ED Triage Vitals  Enc  Vitals Group     BP 09/11/22 1432 110/70     Pulse Rate 09/11/22 1432 65     Resp 09/11/22 1432 18     Temp 09/11/22 1432 98.2 F (36.8 C)     Temp Source 09/11/22 1432 Oral     SpO2 09/11/22 1432 98 %     Weight --      Height --      Head Circumference --      Peak Flow --      Pain Score 09/11/22 1429 9     Pain Loc --      Pain Edu? --      Excl. in GC? --    No data found.  Updated Vital Signs BP 110/70 (BP Location: Right Arm)   Pulse 65   Temp 98.2 F (36.8 C) (Oral)   Resp 18   LMP 09/04/2022 (Approximate)   SpO2 98%   Visual Acuity Right Eye Distance:   Left Eye Distance:   Bilateral Distance:    Right Eye Near:   Left Eye Near:    Bilateral Near:     Physical Exam Vitals and nursing note reviewed.  Constitutional:      Appearance: She is not ill-appearing or toxic-appearing.  HENT:     Head: Normocephalic and atraumatic.     Right Ear: Hearing and external ear normal.     Left Ear: Hearing and external ear normal.     Nose: Nose normal.     Mouth/Throat:     Lips: Pink.  Eyes:     General: Lids are normal. Vision grossly intact. Gaze aligned appropriately.     Extraocular Movements: Extraocular movements intact.     Conjunctiva/sclera: Conjunctivae normal.  Pulmonary:     Effort: Pulmonary effort is normal.  Musculoskeletal:     Right wrist: Normal.     Left wrist: Tenderness (Tenderness starting at the left little finger radiating proximally to the forearm and elbow elicited with Phalens test, non-tender to palpation) present. No swelling, deformity, effusion, lacerations, bony tenderness, snuff box tenderness or crepitus. Normal range of motion. Normal pulse.     Right hand: Normal.     Left hand: Normal.     Cervical back: Neck supple.     Comments: Phalens test positive to left wrist. Strength and sensation intact to bilateral upper extremities. +2 bilateral radial pulses.  Skin:    General: Skin is warm and dry.     Capillary Refill:  Capillary refill takes less than 2 seconds.     Findings: No rash.  Neurological:     General: No focal deficit present.     Mental Status: She is alert and oriented to person, place, and  time. Mental status is at baseline.     Cranial Nerves: No dysarthria or facial asymmetry.  Psychiatric:        Mood and Affect: Mood normal.        Speech: Speech normal.        Behavior: Behavior normal.        Thought Content: Thought content normal.        Judgment: Judgment normal.      UC Treatments / Results  Labs (all labs ordered are listed, but only abnormal results are displayed) Labs Reviewed - No data to display  EKG   Radiology No results found.  Procedures Procedures (including critical care time)  Medications Ordered in UC Medications - No data to display  Initial Impression / Assessment and Plan / UC Course  I have reviewed the triage vital signs and the nursing notes.  Pertinent labs & imaging results that were available during my care of the patient were reviewed by me and considered in my medical decision making (see chart for details).   1. Left median nerve neuropathy, left arm pain Presentation consistent with overuse tendinopathy likely resulting in left median nerve dysfunction. Phalens test positive. Pain has not responded well to NSAID and muscle relaxer, therefore will treat with steroid burst for 5 days. Advised to take steroid with food to avoid stomach upset. May apply ice 20 minutes on 20 minutes off as needed. Wrist brace applied today, advised to wear this for support, stability, and to avoid compression of the median nerve. Walking referral to orthopedics provided for follow-up. May start ibuprofen/mobic again once finished with prednisone.  Discussed physical exam and available lab work findings in clinic with patient.  Counseled patient regarding appropriate use of medications and potential side effects for all medications recommended or prescribed today.  Discussed red flag signs and symptoms of worsening condition,when to call the PCP office, return to urgent care, and when to seek higher level of care in the emergency department. Patient verbalizes understanding and agreement with plan. All questions answered. Patient discharged in stable condition.    Final Clinical Impressions(s) / UC Diagnoses   Final diagnoses:  Left median nerve neuropathy  Left arm pain     Discharge Instructions      I am concerned you may be developing carpal tunnel syndrome which is caused by inflammation and dysfunction of one of the nerves in your forearm/wrist.   Take prednisone 40mg  once daily for the next 5 days with food to reduce inflammation and pain.  Apply ice 20 minutes on 20 minutes off as needed.   Schedule an appointment with orthopedic provider as needed for follow-up evaluation.  Wear the wrist brace provided      ED Prescriptions     Medication Sig Dispense Auth. Provider   predniSONE (DELTASONE) 20 MG tablet Take 2 tablets (40 mg total) by mouth daily for 5 days. 10 tablet Carlisle Beers, FNP      PDMP not reviewed this encounter.   Carlisle Beers, [place] 09/11/22 1501

## 2022-10-23 ENCOUNTER — Other Ambulatory Visit: Payer: Self-pay | Admitting: Family Medicine

## 2022-10-23 DIAGNOSIS — N939 Abnormal uterine and vaginal bleeding, unspecified: Secondary | ICD-10-CM

## 2022-10-23 DIAGNOSIS — Z1231 Encounter for screening mammogram for malignant neoplasm of breast: Secondary | ICD-10-CM

## 2022-10-31 ENCOUNTER — Other Ambulatory Visit: Payer: Medicaid Other

## 2022-11-17 ENCOUNTER — Encounter (HOSPITAL_COMMUNITY): Payer: Self-pay

## 2022-11-17 ENCOUNTER — Ambulatory Visit (HOSPITAL_COMMUNITY)
Admission: EM | Admit: 2022-11-17 | Discharge: 2022-11-17 | Disposition: A | Discharge status: Home / Self Care | Payer: Medicaid Other | Attending: Internal Medicine | Admitting: Internal Medicine

## 2022-11-17 DIAGNOSIS — G8929 Other chronic pain: Secondary | ICD-10-CM | POA: Diagnosis not present

## 2022-11-17 DIAGNOSIS — Z1152 Encounter for screening for COVID-19: Secondary | ICD-10-CM | POA: Diagnosis not present

## 2022-11-17 DIAGNOSIS — R5383 Other fatigue: Secondary | ICD-10-CM

## 2022-11-17 DIAGNOSIS — R0981 Nasal congestion: Secondary | ICD-10-CM

## 2022-11-17 DIAGNOSIS — M546 Pain in thoracic spine: Secondary | ICD-10-CM | POA: Diagnosis not present

## 2022-11-17 LAB — POCT URINALYSIS DIP (MANUAL ENTRY)
Glucose, UA: NEGATIVE mg/dL
Leukocytes, UA: NEGATIVE
Nitrite, UA: NEGATIVE
Spec Grav, UA: >=1.03 — AB (ref 1.010–1.025)
Urobilinogen, UA: 0.2 E.U./dL
pH, UA: 5.5 (ref 5.0–8.0)

## 2022-11-17 MED ORDER — METHOCARBAMOL 500 MG PO TABS: 500.0000 mg | ORAL_TABLET | Freq: Two times a day (BID) | ORAL | 0 refills | Status: DC

## 2022-11-17 NOTE — Discharge Instructions (Addendum)
From your fatigue, body aches and nasal congestion I believe you have a viral illness.  We have swabbed you for COVID-19 and our staff will contact you if positive.  Please rest, drink at least 64 ounces of fluids daily, you can take 1200 mg of Mucinex daily to help loosen up nasal secretions and congestion, and alternate between Tylenol and ibuprofen every 4-6 hours for body aches and discomfort.  Your urine had some irregularities, since you are having flank pain we are sending it off for culture.  We will contact you if antibiotics are indicated.  For your chronic back pain you can take the muscle relaxer as needed.  Do not drink or drive on this medication as it may cause drowsiness.  Since this is a recurrent issue, I suggest getting further evaluation through [facility].  Return to clinic for new or urgent issues.

## 2022-11-17 NOTE — ED Triage Notes (Signed)
Right side flank pain and back pain on and off for years. Patient was on muscle relaxer's but has been off for a month. Onset Friday night pain flared up and is severe. No known falls or injuries.

## 2022-11-17 NOTE — ED Provider Notes (Signed)
[facility]    CSN: [id number] Arrival date & time: 11/17/22  1318      History   Chief Complaint Chief Complaint  Patient presents with   Back Pain   Flank Pain    HPI [name] [name] is a 47 y.o. female.   Reports back pain has been ongoing for years, but started to flare up again on Friday.  Reports yesterday she was so fatigued that she slept all day and had to call out of work.  She did have diarrhea on Thursday.  She has been having hot and cold chills with sweats, has not measured her temperature.  Reports she is having nasal congestion as well.  Denies any known recent sick contacts.  Has used muscle relaxers in the past for her back pain, these have worked well. Has been getting massages each night w/o relief.   She denies cough, chest pain or shortness of breath. No abdominal pain. No nausea or emesis.     The history is provided by the patient and medical records.  Back Pain Associated symptoms: no abdominal pain, no chest pain, no dysuria and no fever   Flank Pain Pertinent negatives include no chest pain, no abdominal pain and no shortness of breath.    Past Medical History:  Diagnosis Date   Anemia    Arthritis     Patient Active Problem List   Diagnosis Date Noted   Neck pain 03/15/2016   Muscle strain 03/15/2016    Past Surgical History:  Procedure Laterality Date   CESAREAN SECTION      OB History   No obstetric history on file.      Home Medications    Prior to Admission medications   Medication Sig Start Date End Date Taking? Authorizing Provider  aspirin-acetaminophen-caffeine (EXCEDRIN MIGRAINE) [phone] MG tablet Take 1 tablet by mouth every 6 (six) hours as needed for headache.   Yes [provider]  methocarbamol (ROBAXIN) 500 MG tablet Take 1 tablet (500 mg total) by mouth 2 (two) times daily. 11/17/22  Yes Rinaldo Ratel, [place] N, FNP  Vitamin D, Ergocalciferol, (DRISDOL) 1.25 MG (50000 UNIT) CAPS capsule Take  50,000 Units by mouth once a week. 09/05/22  Yes [provider]    Family History Family History  Problem Relation Age of Onset   High blood pressure Mother    High blood pressure Father     Social History Social History   Tobacco Use   Smoking status: Every Day    Current packs/day: 0.20    Types: Cigars, Cigarettes   Smokeless tobacco: Never   Tobacco comments:    3 cigars a day  Vaping Use   Vaping status: Never Used  Substance Use Topics   Alcohol use: No   Drug use: No     Allergies   Patient has no known allergies.   Review of Systems Review of Systems  Constitutional:  Positive for chills and fatigue. Negative for fever.  HENT:  Positive for congestion and rhinorrhea. Negative for sore throat.   Respiratory:  Negative for cough and shortness of breath.   Cardiovascular:  Negative for chest pain.  Gastrointestinal:  Positive for diarrhea. Negative for abdominal pain, nausea and vomiting.  Genitourinary:  Positive for flank pain. Negative for dysuria.  Musculoskeletal:  Positive for back pain.     Physical Exam Triage Vital Signs ED Triage Vitals  Encounter Vitals Group     BP 11/17/22 1357 (!) 140/90  Systolic BP Percentile --      Diastolic BP Percentile --      Pulse Rate 11/17/22 1357 64     Resp 11/17/22 1357 18     Temp 11/17/22 1357 98.5 F (36.9 C)     Temp Source 11/17/22 1357 Oral     SpO2 11/17/22 1357 98 %     Weight 11/17/22 1356 203 lb (92.1 kg)     Height 11/17/22 1356 5\' 1"  (1.549 m)     Head Circumference --      Peak Flow --      Pain Score 11/17/22 1355 10     Pain Loc --      Pain Education --      Exclude from Growth Chart --    No data found.  Updated Vital Signs BP (!) 140/90 (BP Location: Right Arm)   Pulse 64   Temp 98.5 F (36.9 C) (Oral)   Resp 18   Ht 5\' 1"  (1.549 m)   Wt 203 lb (92.1 kg)   LMP 11/08/2022 (Approximate)   SpO2 98%   BMI 38.36 kg/m   Visual Acuity Right Eye Distance:   Left  Eye Distance:   Bilateral Distance:    Right Eye Near:   Left Eye Near:    Bilateral Near:     Physical Exam Vitals and nursing note reviewed.  Constitutional:      Appearance: Normal appearance.  HENT:     Head: Normocephalic and atraumatic.     Right Ear: External ear normal.     Left Ear: External ear normal.     Nose: Congestion present.     Mouth/Throat:     Mouth: Mucous membranes are moist.  Eyes:     Conjunctiva/sclera: Conjunctivae normal.  Cardiovascular:     Rate and Rhythm: Normal rate.  Pulmonary:     Effort: Pulmonary effort is normal. No respiratory distress.  Musculoskeletal:        General: Tenderness present. No swelling, deformity or signs of injury. Normal range of motion.     Cervical back: Normal.     Thoracic back: Tenderness present.     Lumbar back: Normal.       Back:  Skin:    General: Skin is warm and dry.  Neurological:     General: No focal deficit present.     Mental Status: She is alert and oriented to person, place, and time.  Psychiatric:        Mood and Affect: Mood normal.        Behavior: Behavior normal. Behavior is cooperative.      UC Treatments / Results  Labs (all labs ordered are listed, but only abnormal results are displayed) Labs Reviewed  POCT URINALYSIS DIP (MANUAL ENTRY) - Abnormal; Notable for the following components:      Result Value   Bilirubin, UA small (*)    Ketones, POC UA trace (5) (*)    Spec Grav, UA >=1.030 (*)    Blood, UA moderate (*)    Protein Ur, POC trace (*)    All other components within normal limits  URINE CULTURE  SARS CORONAVIRUS 2 (TAT 6-24 HRS)    EKG   Radiology No results found.  Procedures Procedures (including critical care time)  Medications Ordered in UC Medications - No data to display  Initial Impression / Assessment and Plan / UC Course  I have reviewed the triage vital signs and the nursing notes.  Pertinent  labs & imaging results that were available during  my care of the patient were reviewed by me and considered in my medical decision making (see chart for details).  Vitals and triage reviewed, patient is hemodynamically stable.  Symptoms are consistent with a viral illness, will test for COVID-19 in clinic.  Symptomatic management of viral illness discussed.  Suspect flareup of chronic back pain from viral illness, will trial muscle relaxers and encourage orthopedic follow-up if needed.  Urinalysis with moderate red blood cells, due to flank pain, will send for culture.  Plan of care, follow-up care and return precautions given, no questions at this time.  Work note provided.     Final Clinical Impressions(s) / UC Diagnoses   Final diagnoses:  Chronic right-sided thoracic back pain  Other fatigue  Nasal congestion     Discharge Instructions      From your fatigue, body aches and nasal congestion I believe you have a viral illness.  We have swabbed you for COVID-19 and our staff will contact you if positive.  Please rest, drink at least 64 ounces of fluids daily, you can take 1200 mg of Mucinex daily to help loosen up nasal secretions and congestion, and alternate between Tylenol and ibuprofen every 4-6 hours for body aches and discomfort.  Your urine had some irregularities, since you are having flank pain we are sending it off for culture.  We will contact you if antibiotics are indicated.  For your chronic back pain you can take the muscle relaxer as needed.  Do not drink or drive on this medication as it may cause drowsiness.  Since this is a recurrent issue, I suggest getting further evaluation through [facility].  Return to clinic for new or urgent issues.      ED Prescriptions     Medication Sig Dispense Auth. Provider   methocarbamol (ROBAXIN) 500 MG tablet Take 1 tablet (500 mg total) by mouth 2 (two) times daily. 20 tablet Etai Copado, [place], [place]      PDMP not reviewed this encounter.   Tayla Panozzo,  [place], [place] 11/17/22 1438

## 2022-11-18 LAB — URINE CULTURE: Culture: <10000 — AB

## 2022-11-18 LAB — SARS CORONAVIRUS 2 (TAT 6-24 HRS): SARS Coronavirus 2: NEGATIVE

## 2022-12-24 IMAGING — DX DG CHEST 2V
2 series · 2 of 2 positions shown · non-contrast
Comparison: June 03, 2018

CLINICAL DATA: COVID positive with fever.

EXAM:
CHEST - 2 VIEW

[chest pa]
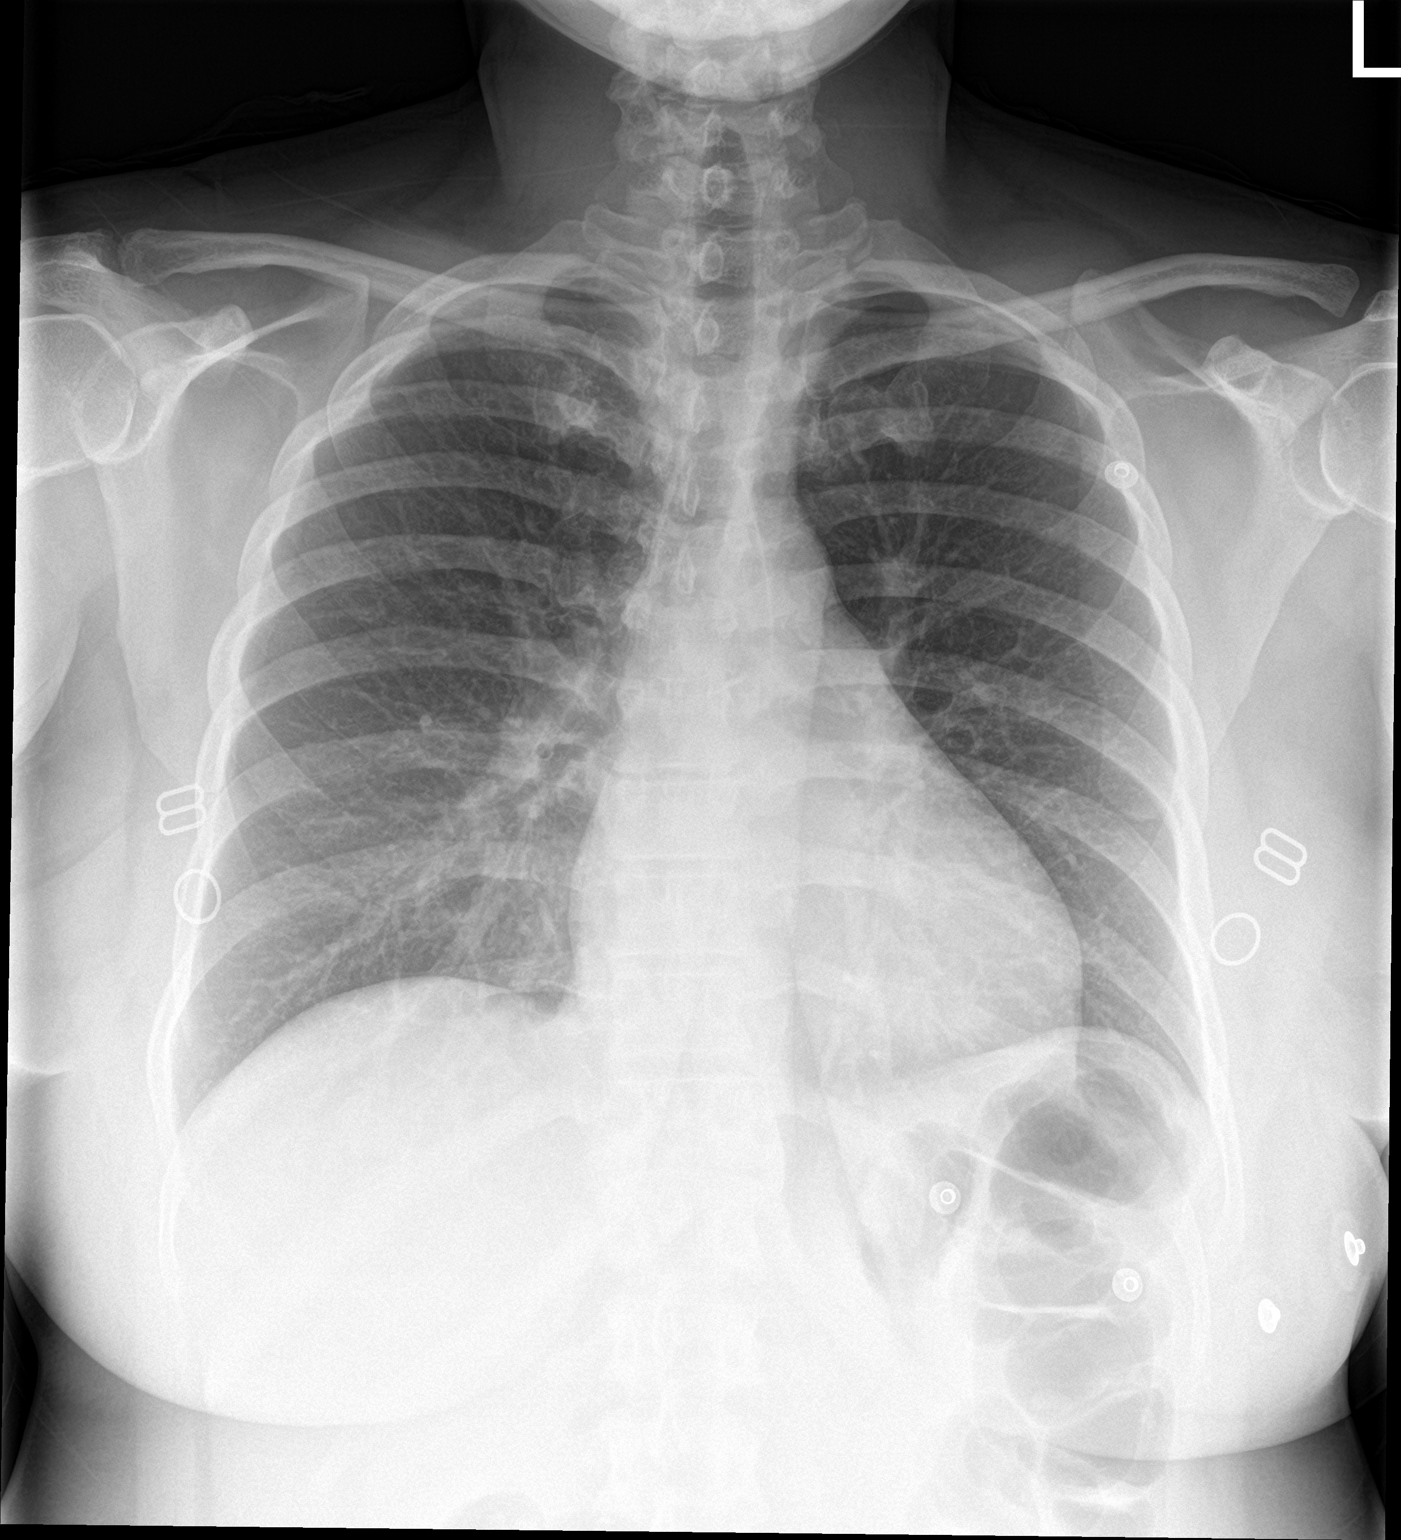

[chest lat]
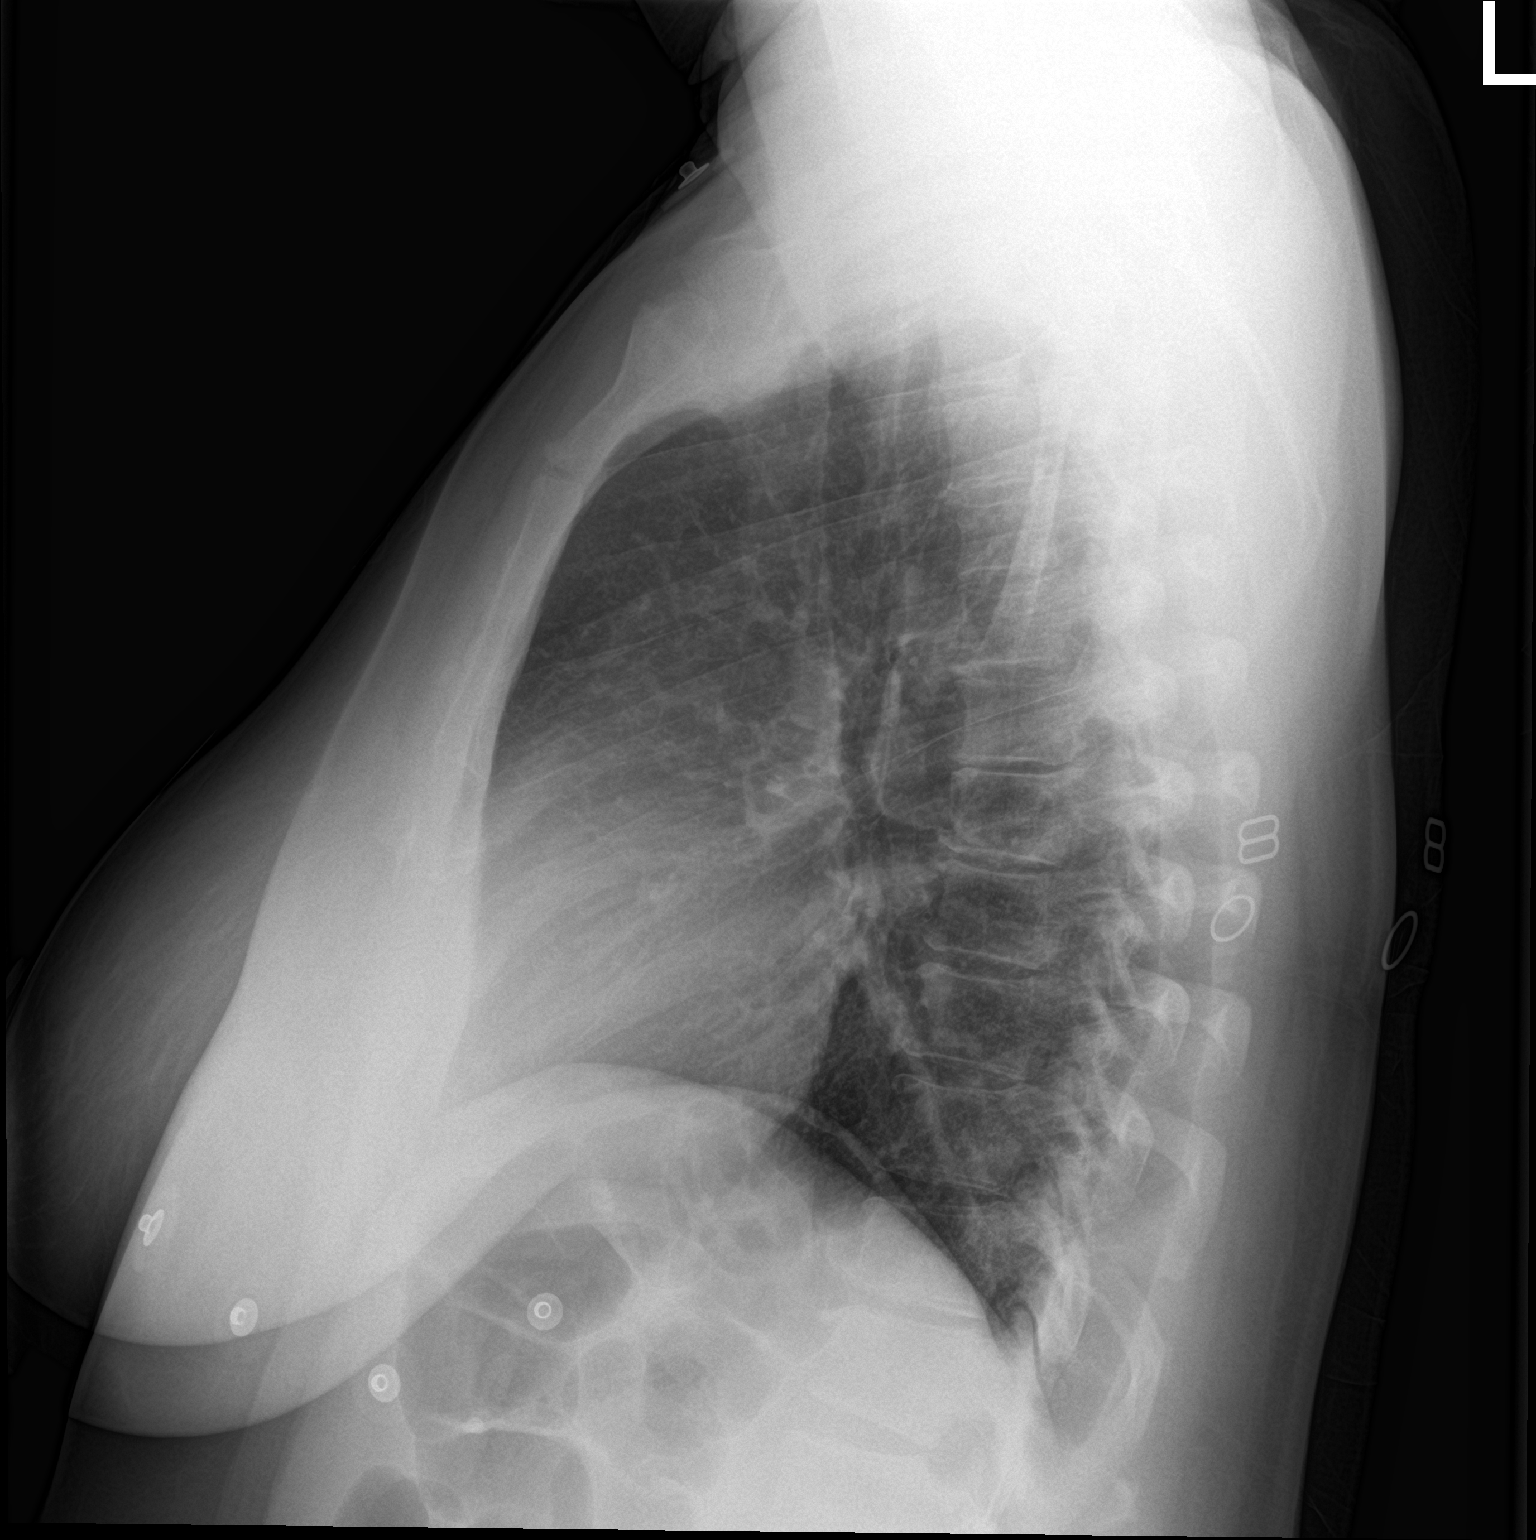

[2 of 2 positions shown; findings below may reference images not displayed]

FINDINGS: The heart size and mediastinal contours are within normal limits.
Both lungs are clear. The visualized skeletal structures are
unremarkable.
IMPRESSION: No active cardiopulmonary disease.

## 2023-01-07 ENCOUNTER — Emergency Department (HOSPITAL_COMMUNITY)
Admission: EM | Admit: 2023-01-07 | Discharge: 2023-01-07 | Discharge status: Against Medical Advice | Payer: Medicaid Other | Source: Home / Self Care

## 2023-03-07 ENCOUNTER — Emergency Department (HOSPITAL_COMMUNITY)
Admission: EM | Admit: 2023-03-07 | Discharge: 2023-03-07 | Disposition: A | Discharge status: Home / Self Care | Payer: No Typology Code available for payment source | Attending: Emergency Medicine | Admitting: Emergency Medicine

## 2023-03-07 ENCOUNTER — Other Ambulatory Visit: Payer: Self-pay

## 2023-03-07 ENCOUNTER — Encounter (HOSPITAL_COMMUNITY): Payer: Self-pay

## 2023-03-07 DIAGNOSIS — M79631 Pain in right forearm: Secondary | ICD-10-CM | POA: Insufficient documentation

## 2023-03-07 DIAGNOSIS — M545 Low back pain, unspecified: Secondary | ICD-10-CM | POA: Insufficient documentation

## 2023-03-07 DIAGNOSIS — Y92410 Unspecified street and highway as the place of occurrence of the external cause: Secondary | ICD-10-CM | POA: Insufficient documentation

## 2023-03-07 DIAGNOSIS — M7918 Myalgia, other site: Secondary | ICD-10-CM

## 2023-03-07 MED ORDER — IBUPROFEN 800 MG PO TABS
800.0000 mg | ORAL_TABLET | Freq: Once | ORAL | Status: AC
  Administered 2023-03-07: 800 mg via ORAL
  Filled 2023-03-07: qty 1

## 2023-03-07 MED ORDER — CYCLOBENZAPRINE HCL 10 MG PO TABS: 5.0000 mg | ORAL_TABLET | Freq: Every day | ORAL | 0 refills | Status: AC
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
## 2023-03-16 ENCOUNTER — Emergency Department (HOSPITAL_COMMUNITY)
Admission: EM | Admit: 2023-03-16 | Discharge: 2023-03-16 | Disposition: A | Discharge status: Home / Self Care | Payer: Medicaid Other | Attending: Emergency Medicine | Admitting: Emergency Medicine

## 2023-03-16 ENCOUNTER — Other Ambulatory Visit: Payer: Self-pay

## 2023-03-16 ENCOUNTER — Emergency Department (HOSPITAL_COMMUNITY): Payer: Medicaid Other

## 2023-03-16 ENCOUNTER — Encounter (HOSPITAL_COMMUNITY): Payer: Self-pay

## 2023-03-16 DIAGNOSIS — Z7982 Long term (current) use of aspirin: Secondary | ICD-10-CM | POA: Diagnosis not present

## 2023-03-16 DIAGNOSIS — M79601 Pain in right arm: Secondary | ICD-10-CM | POA: Diagnosis not present

## 2023-03-16 DIAGNOSIS — R0789 Other chest pain: Secondary | ICD-10-CM | POA: Diagnosis present

## 2023-03-16 DIAGNOSIS — R519 Headache, unspecified: Secondary | ICD-10-CM | POA: Insufficient documentation

## 2023-03-16 DIAGNOSIS — R531 Weakness: Secondary | ICD-10-CM | POA: Diagnosis not present

## 2023-03-16 DIAGNOSIS — R2990 Unspecified symptoms and signs involving the nervous system: Secondary | ICD-10-CM

## 2023-03-16 LAB — CBC WITH DIFFERENTIAL/PLATELET
Abs Immature Granulocytes: 0.03 10*3/uL (ref 0.00–0.07)
Basophils Absolute: 0 10*3/uL (ref 0.0–0.1)
Basophils Relative: 0 %
Eosinophils Absolute: 0.1 10*3/uL (ref 0.0–0.5)
Eosinophils Relative: 1 %
HCT: 37 % (ref 36.0–46.0)
Hemoglobin: 11.5 g/dL — ABNORMAL LOW (ref 12.0–15.0)
Immature Granulocytes: 0 %
Lymphocytes Relative: 16 %
Lymphs Abs: 1.4 10*3/uL (ref 0.7–4.0)
MCH: 27.5 pg (ref 26.0–34.0)
MCHC: 31.1 g/dL (ref 30.0–36.0)
MCV: 88.5 fL (ref 80.0–100.0)
Monocytes Absolute: 0.6 10*3/uL (ref 0.1–1.0)
Monocytes Relative: 7 %
Neutro Abs: 6.7 10*3/uL (ref 1.7–7.7)
Neutrophils Relative %: 76 %
Platelets: 292 10*3/uL (ref 150–400)
RBC: 4.18 MIL/uL (ref 3.87–5.11)
RDW: 17.8 % — ABNORMAL HIGH (ref 11.5–15.5)
WBC: 8.8 10*3/uL (ref 4.0–10.5)
nRBC: 0 % (ref 0.0–0.2)

## 2023-03-16 LAB — TROPONIN I (HIGH SENSITIVITY)
Troponin I (High Sensitivity): 5 ng/L (ref <18)
Troponin I (High Sensitivity): 4 ng/L (ref <18)

## 2023-03-16 LAB — COMPREHENSIVE METABOLIC PANEL
ALT: 11 U/L (ref 0–44)
AST: 14 U/L — ABNORMAL LOW (ref 15–41)
Albumin: 3.2 g/dL — ABNORMAL LOW (ref 3.5–5.0)
Alkaline Phosphatase: 52 U/L (ref 38–126)
Anion gap: 6 (ref 5–15)
BUN: 10 mg/dL (ref 6–20)
CO2: 23 mmol/L (ref 22–32)
Calcium: 8.8 mg/dL — ABNORMAL LOW (ref 8.9–10.3)
Chloride: 108 mmol/L (ref 98–111)
Creatinine, Ser: 0.79 mg/dL (ref 0.44–1.00)
GFR, Estimated: >60 mL/min (ref >60)
Glucose, Bld: 86 mg/dL (ref 70–99)
Potassium: 3.8 mmol/L (ref 3.5–5.1)
Sodium: 137 mmol/L (ref 135–145)
Total Bilirubin: 0.8 mg/dL (ref <1.2)
Total Protein: 7 g/dL (ref 6.5–8.1)

## 2023-03-16 MED ORDER — METOCLOPRAMIDE HCL 5 MG/ML IJ SOLN
5.0000 mg | Freq: Once | INTRAMUSCULAR | Status: AC
  Administered 2023-03-16: 5 mg via INTRAVENOUS
  Filled 2023-03-16: qty 2

## 2023-03-16 MED ORDER — IOHEXOL 350 MG/ML SOLN
100.0000 mL | Freq: Once | INTRAVENOUS | Status: AC | PRN
  Administered 2023-03-16: 100 mL via INTRAVENOUS

## 2023-03-16 MED ORDER — SODIUM CHLORIDE 0.9 % IV BOLUS
500.0000 mL | Freq: Once | INTRAVENOUS | Status: AC
  Administered 2023-03-16: 500 mL via INTRAVENOUS

## 2023-03-16 MED ORDER — LORAZEPAM 1 MG PO TABS
1.0000 mg | ORAL_TABLET | Freq: Once | ORAL | Status: AC
  Administered 2023-03-16: 1 mg via ORAL
  Filled 2023-03-16: qty 1

## 2023-03-16 MED ORDER — KETOROLAC TROMETHAMINE 30 MG/ML IJ SOLN
30.0000 mg | Freq: Once | INTRAMUSCULAR | Status: AC
  Administered 2023-03-16: 30 mg via INTRAVENOUS
  Filled 2023-03-16: qty 1
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
## 2023-03-16 NOTE — ED Provider Notes (Signed)
  Physical Exam  BP 132/86   Pulse 64   Temp 97.8 F (36.6 C) (Oral)   Resp 17   LMP 02/11/2023 (Exact Date)   SpO2 100%   Physical Exam Vitals and nursing note reviewed.  Constitutional:      General: She is not in acute distress.    Appearance: She is not toxic-appearing.  HENT:     Head: Normocephalic.  Cardiovascular:     Rate and Rhythm: Normal rate and regular rhythm.  Pulmonary:     Breath sounds: Normal breath sounds.  Skin:    General: Skin is warm and dry.     Capillary Refill: Capillary refill takes less than 2 seconds.  Neurological:     General: No focal deficit present.     Mental Status: She is alert.  Psychiatric:        Mood and Affect: Mood normal.        Behavior: Behavior normal.     Procedures  Procedures  ED Course / MDM    Medical Decision Making 47 year old female seen primarily by morning team signed out with pending CT scans.  See their note for full HPI.  CT scans negative for acute pathology.  Patient received migraine cocktail from prior team.  Reports improvement of her symptoms.  Labs reviewed largely reassuring.  No gross localizing deficits.  Will have patient follow-up outpatient neurology.   Amount and/or Complexity of Data Reviewed Labs: ordered. Radiology: ordered.  Risk Prescription drug management.        Coral Spikes, DO 03/16/23 2339

## 2023-03-16 NOTE — ED Triage Notes (Signed)
Pt c.o right sided chest pain/soreness. Pt also reports last night around 1230am she felt like the right side of her face was "twisted" along with right arm heaviness and numbness, lasting about 4 mins. Pt denies any of those symptoms at this time

## 2023-03-16 NOTE — Discharge Instructions (Addendum)
Please follow-up with neurology.  I have put in a referral, they should contact you to schedule appointment.  However, if they do not call you, have the number and please call them.  Return immediately to the emergency department if you develop sudden onset headache, vision changes, facial droop, difficulty finding words, unilateral weakness, passout, chest pain, shortness of breath or you develop any new or worsening symptoms that are concerning to you.

## 2023-03-18 NOTE — Progress Notes (Signed)
NEUROLOGY FOLLOW UP OFFICE NOTE  [name] [name] [id number]  Subjective:  [name] [name] is a 47 y.o. year old right-handed female with a medical history of chronic back pain, migraines, current smoker who we last saw on 12/19/21 for headaches.  To briefly review: 12/19/21: Patient was seen at [facility] by Sherron Flemings on 10/23/21. Patient mentioned that she had headaches since 2002. She was prescribed extra strength excedrin for headaches and robaxin for chronic back pain.   Onset: at least by 2002 Quality: Pressure and pounding sensations Intensity: "11/10" Location: Neck, top of head or left face or whole face Duration: few hours to days Frequency: 15+ days Associated symptoms: occasional nausea and vomiting; neck and shoulder stiffness and tightness; occasional photophobia and phonophobia Aura: No Activity: Has to lay down and stop what she is doing. Will put a heating pad on head Aggravating factors: Flexing neck Relieving factors: Heating pad, taking excedrin   Current abortive medications: Excedrin (helps get rid of her headaches, pretty instantly; prevents nausea and vomiting); mobic and robaxin for back/neck pain Current prophylactic medications: None   Past abortive or prophylactic medications: May have had many years ago, but patient cannot recall and did not recognize names of commonly used medications. She has tried ibuprofen and tylenol in the past that did not help.   Frequency of medications: Takes excedrin every day (once every day, twice in a day if she has a headache) Family history: No Smoker: Yes Alcohol: No Caffeine: 1-2 cans per week Sleep: "Sleep too much", fatigue Mood: Been down, lost Mom in April 2023  Most recent Assessment and Plan (12/19/21): [name] [name] is a 47 y.o. female who presents for evaluation of headaches. She has a relevant medical history of chronic back pain, migraines, and is a current smoker. Her neurological  examination is essentially normal. However, patient has reduced range of motion of her neck, particularly when turning her head to the right. She has significant cervical paraspinal tenderness as well. Available diagnostic data is significant for vit D of 4.6. CT head in 2019 was normal.    Symptoms are most consistent with chronic migraines without aura. There is certainly a cervicogenic element to her headaches as well. She is currently controlled with Excedrin but still having at least 15 days of headaches per month. She is not currently on a preventative medication.   PLAN: Refer to physical therapy for neck pain Start nortriptyline 10mg  at bedtime for migraine prevention Take Excedrin as needed for migraine attack and nausea Limit use of pain relievers to no more than 2 days out of week to prevent risk of rebound or medication-overuse headache. Consider Magnesium and Riboflavin Supplementation Headache lifestyle recommendations given   -Continue Vit D supplementation 50000 IU weekly for vit D deficiency   -Return to clinic in 1 month  Since their last visit: Patient did not follow up as planned.   She presented to ED on 03/16/23 with right sided chest pain and an episode of right sided weakness and numbness (face, arm, and leg). Symptoms may have started on 03/14/23 with chest discomfort, thinking it was her GERD. The symptoms came on strongly on 03/15/23 with chest pain radiating into the right arm/shoulder area. She felt the elbow down was weak. The pain was severe for 4 minutes then lasted a few days until complete resolution. She now has soreness, particularly when moving a certain way or when breathing.  MRI brain was normal. CTA head and neck showed  no large vessel occlusion or significant stenosis.  She had no further episodes of these symptoms.  She continues to smoke, but only is smoking about 2 cigarettes per day.  In terms of headaches, she is doing well. She is not has had  any recent headaches. She is not taking Nortriptyline. She will get an occasional back/neck pain and headache and occasionally take Excedrin.  Patient went to PT once but then stopped showing up, so she was discharged.  She is not currently taking any medications regularly.  MEDICATIONS:  Outpatient Encounter Medications as of 03/21/2023  Medication Sig   aspirin-acetaminophen-caffeine (EXCEDRIN MIGRAINE) 250-250-65 MG tablet Take 1 tablet by mouth every 6 (six) hours as needed for headache.   methylPREDNISolone (MEDROL DOSEPAK) 4 MG TBPK tablet Take 6 tablets for 1 day, then 5 tablets for 1 day, then 4 tablets for 1 day, then 3 tablets for 1 day, then 2 tablets for 1 day, then 1 tablet for 1 day, then STOP   [DISCONTINUED] hydrochlorothiazide (HYDRODIURIL) 12.5 MG tablet Take 12.5 mg by mouth daily. (Patient not taking: Reported on 03/21/2023)   [DISCONTINUED] Vitamin D, Ergocalciferol, (DRISDOL) 1.25 MG (50000 UNIT) CAPS capsule Take 50,000 Units by mouth once a week. (Patient not taking: Reported on 03/21/2023)   No facility-administered encounter medications on file as of 03/21/2023.    PAST MEDICAL HISTORY: Past Medical History:  Diagnosis Date   Anemia    Arthritis     PAST SURGICAL HISTORY: Past Surgical History:  Procedure Laterality Date   CESAREAN SECTION      ALLERGIES: No Known Allergies  FAMILY HISTORY: Family History  Problem Relation Age of Onset   High blood pressure Mother    High blood pressure Father     SOCIAL HISTORY: Social History   Tobacco Use   Smoking status: Every Day    Current packs/day: 0.20    Types: Cigars, Cigarettes   Smokeless tobacco: Never   Tobacco comments:    3 cigars a day  Vaping Use   Vaping status: Never Used  Substance Use Topics   Alcohol use: No   Drug use: No   Social History   Social History Narrative   Rt handed   Caffeine 1-2 can a week   Live in a one story home      Objective:  Vital Signs:  BP (!)  157/86   Pulse 72   Ht 5\' 1"  (1.549 m)   Wt 219 lb (99.3 kg)   LMP 02/11/2023 (Exact Date)   SpO2 100%   BMI 41.38 kg/m   General: No acute distress.  Patient appears well-groomed.   Head:  Normocephalic/atraumatic Eyes:  fundi examined but not visualized Neck: supple, no paraspinal tenderness, full range of motion Chest: Pain on palpation of right chest Heart: regular rate and rhythm Lungs: Clear to auscultation bilaterally. Vascular: No carotid bruits. Extremities: Pain to palpation of the medial aspect of the right elbow  Neurological Exam: Mental status: alert and oriented, speech fluent and not dysarthric, language intact.  Cranial nerves: CN I: not tested CN II: pupils equal, round and reactive to light, visual fields intact CN III, IV, VI:  full range of motion, no nystagmus, no ptosis CN V: facial sensation intact. CN VII: upper and lower face symmetric CN VIII: hearing intact CN IX, X: uvula midline CN XI: sternocleidomastoid and trapezius muscles intact CN XII: tongue midline  Bulk & Tone: normal. Motor:  muscle strength 5/5 throughout Deep Tendon Reflexes:  2+ throughout.   Sensation:  Pinprick sensation intact. Finger to nose testing:  Without dysmetria.   Gait:  Normal station and stride.   Labs and Imaging review: New results: 03/16/23: CMP unremarkable CBC w/ diff unremarkable  10/22/22 (external): Vit D: 26.2  CTA head and neck (03/16/23): FINDINGS: CT HEAD FINDINGS   Brain: No acute intracranial hemorrhage. Gray-white differentiation is preserved. No hydrocephalus or extra-axial collection. No mass effect or midline shift. Partially empty sella.   Vascular: No hyperdense vessel or unexpected calcification.   Skull: No calvarial fracture or suspicious bone lesion. Skull base is unremarkable.   Sinuses/Orbits: No acute finding.   Other: None.   Review of the MIP images confirms the above findings   CTA NECK FINDINGS   Aortic arch:  Two-vessel arch configuration with common origin of the right brachiocephalic and left common carotid arteries. Arch vessel origins are patent.   Right carotid system: No evidence of dissection, stenosis (50% or greater), or occlusion.   Left carotid system: No evidence of dissection, stenosis (50% or greater), or occlusion.   Vertebral arteries: Codominant. No evidence of dissection, stenosis (50% or greater), or occlusion.   Skeleton: Mild cervical spondylosis without high-grade spinal canal stenosis.   Other neck: Unremarkable.   Upper chest: Unremarkable.   Review of the MIP images confirms the above findings   CTA HEAD FINDINGS   Anterior circulation: Intracranial ICAs are patent without stenosis or aneurysm. The proximal ACAs and MCAs are patent without stenosis or aneurysm. Distal branches are symmetric.   Posterior circulation: Normal basilar artery. The SCAs and PICAs are patent proximally. The PCAs are patent proximally without stenosis or aneurysm. Distal branches are symmetric.   Venous sinuses: As permitted by contrast timing, patent.   Anatomic variants: None.   Review of the MIP images confirms the above findings   IMPRESSION: 1. No acute intracranial abnormality. 2. No large vessel occlusion, hemodynamically significant stenosis, or aneurysm in the head or neck.  MRI brain wo contrast (03/16/23): FINDINGS: Brain: No acute infarct, hemorrhage, or mass lesion is present. No significant white matter lesions are present. Deep brain nuclei are within normal limits. The ventricles are of normal size. No significant extraaxial fluid collection is present.   The brainstem and cerebellum are within normal limits. A relatively empty sella is present. Pituitary stalk is midline. Midline structures are otherwise within normal limits.   Vascular: Flow is present in the major intracranial arteries.   Skull and upper cervical spine: Mild degenerative changes  are present in the upper cervical spine. The craniocervical junction is normal. Marrow signal is normal.   Sinuses/Orbits: The paranasal sinuses and mastoid air cells are clear. The globes and orbits are within normal limits.   IMPRESSION: Normal MRI appearance of the brain. No acute or focal lesion to explain the patient's symptoms.  Previously reviewed results: External labs: HbA1c (10/23/21): 5.6 CBC significant for anemia (Hb 9.7) CMP unremarkable TSH 1.430 Vit D 4.6    Imaging: CT head wo contrast (07/05/17): FINDINGS: Brain: The brain shows a normal appearance without evidence of malformation, atrophy, old or acute small or large vessel infarction, mass lesion, hemorrhage, hydrocephalus or extra-axial collection. Slight asymmetry of the lateral ventricles is a normal variant.   Vascular: No hyperdense vessel. No evidence of atherosclerotic calcification.   Skull: Normal.  No traumatic finding.  No focal bone lesion.   Sinuses/Orbits: Sinuses are clear. Orbits appear normal. Mastoids are clear.   Other: None significant   IMPRESSION:  Normal head CT   Cervical spine xray (01/18/17): FINDINGS: Normal alignment. Negative for fracture. Disc degeneration and spurring on the right at C5-6 and C6-7 with moderate foraminal stenosis. This is unchanged from the prior study. Mild left foraminal narrowing at C4-5 and C5-6 and C6-7. Prevertebral soft tissues normal.   IMPRESSION: Disc degeneration and spurring right greater than left. No acute abnormality.   CT cervical spine wo contrast (07/02/14): FINDINGS: C1 through the cervicothoracic junction is visualized in its entirety. There is no evidence of cervical spine fracture or prevertebral soft tissue swelling. Alignment is normal with the exception of loss of the normal cervical lordosis centered at C5 which could be positional. No other significant bone abnormalities are identified. Mild disc degenerative change is  noted at C5-C6 with minimal if any mass effect upon the right neural foramen at that level. Airways patent and midline.   IMPRESSION: No acute abnormality.  Mild C5-C6 disc degenerative change.  Assessment/Plan:  This is [name] [name], a 47 y.o. female with right chest and arm pain. It is reproducible to palpation of the chest and right medial aspect of the elbow. MRI brain was normal as is her neurologic exam. The etiology is likely MSK in nature, perhaps costochondritis or tendonitis. She also mentions fatigue and poor mood in the setting of previously low vit D. She had previously seen me for headaches. This is no longer an issue per patient and she is no longer on medication.  Plan: -Vit D supplementation 1000 international units -Medrol dose pack for chest and arm pain -If symptoms do not improve, patient will discuss with PCP  Return to clinic as needed  Total time spent reviewing records, interview, history/exam, documentation, and coordination of care on day of encounter:  40 min  Jacquelyne Balint, MD

## 2023-03-21 ENCOUNTER — Ambulatory Visit: Payer: Medicaid Other | Admitting: Neurology

## 2023-03-21 ENCOUNTER — Encounter: Payer: Self-pay | Admitting: Neurology

## 2023-03-21 VITALS — BP 157/86 | HR 72 | Ht 61.0 in | Wt 219.0 lb

## 2023-03-21 DIAGNOSIS — R0789 Other chest pain: Secondary | ICD-10-CM | POA: Diagnosis not present

## 2023-03-21 DIAGNOSIS — M79601 Pain in right arm: Secondary | ICD-10-CM | POA: Diagnosis not present

## 2023-03-21 DIAGNOSIS — M542 Cervicalgia: Secondary | ICD-10-CM

## 2023-03-21 DIAGNOSIS — G4486 Cervicogenic headache: Secondary | ICD-10-CM

## 2023-03-21 DIAGNOSIS — E559 Vitamin D deficiency, unspecified: Secondary | ICD-10-CM

## 2023-03-21 MED ORDER — METHYLPREDNISOLONE 4 MG PO TBPK: ORAL_TABLET | ORAL | 0 refills | Status: DC

## 2023-03-21 NOTE — Patient Instructions (Signed)
I saw you today for the right sided chest pain and arm soreness. I think this could be inflammation that is causing the symptoms. There is no evidence of stroke or nerve damage behind your symptoms.  I will prescribe a short course of steroids (6 days) to help reduce the inflammation. I sent this to your pharmacy.  Your labs have also shown a low vitamin D. I recommend you take vitamin D 1000 international units every day. You can pick this up over the counter at any drug store or online.  If your symptoms do not improve, I want you to discuss with your primary care doctor to discuss what else you might have to do.  The physicians and staff at [facility] are committed to providing excellent care. You may receive a survey requesting feedback about your experience at our office. We strive to receive "very good" responses to the survey questions. If you feel that your experience would prevent you from giving the office a "very good " response, please contact our office to try to remedy the situation. We may be reached at [phone]. Thank you for taking the time out of your busy day to complete the survey.  Jacquelyne Balint, MD [facility]

## 2023-05-20 ENCOUNTER — Encounter (HOSPITAL_COMMUNITY): Payer: Self-pay

## 2023-05-20 ENCOUNTER — Ambulatory Visit (HOSPITAL_COMMUNITY)
Admission: EM | Admit: 2023-05-20 | Discharge: 2023-05-20 | Disposition: A | Discharge status: Home / Self Care | Payer: 59

## 2023-05-20 DIAGNOSIS — S0183XA Puncture wound without foreign body of other part of head, initial encounter: Secondary | ICD-10-CM

## 2023-05-20 MED ORDER — IBUPROFEN 400 MG PO TABS: 400.0000 mg | ORAL_TABLET | Freq: Three times a day (TID) | ORAL | 0 refills | Status: AC | PRN

## 2023-05-20 MED ORDER — LIDOCAINE-EPINEPHRINE 1 %-1:100000 IJ SOLN
INTRAMUSCULAR | Status: AC
  Filled 2023-05-20: qty 1

## 2023-05-20 MED ORDER — SULFAMETHOXAZOLE-TRIMETHOPRIM 800-160 MG PO TABS: 1.0000 | ORAL_TABLET | Freq: Two times a day (BID) | ORAL | 0 refills | Status: AC
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
## 2023-05-20 NOTE — Discharge Instructions (Signed)
 I have enclosed information about how to care for your puncture wound at home.    Please begin Bactrim 1 tablet twice daily for 5 days for infection prevention.  Please allow the Steri-Strips to remain in place until it falls off on its own.  Please monitor the wound for signs of infection and follow-up as needed.  Thank you for visiting [facility] today.

## 2023-07-23 ENCOUNTER — Ambulatory Visit: Payer: Medicaid Other | Admitting: Neurology

## 2024-03-29 ENCOUNTER — Ambulatory Visit (HOSPITAL_COMMUNITY)
Admission: EM | Admit: 2024-03-29 | Discharge: 2024-03-29 | Disposition: A | Discharge status: Home / Self Care | Source: Ambulatory Visit

## 2024-03-29 ENCOUNTER — Encounter (HOSPITAL_COMMUNITY): Payer: Self-pay

## 2024-03-29 DIAGNOSIS — I10 Essential (primary) hypertension: Secondary | ICD-10-CM

## 2024-03-29 DIAGNOSIS — G44209 Tension-type headache, unspecified, not intractable: Secondary | ICD-10-CM | POA: Diagnosis not present

## 2024-03-29 MED ORDER — DICLOFENAC SODIUM 50 MG PO TBEC: 50.0000 mg | DELAYED_RELEASE_TABLET | Freq: Two times a day (BID) | ORAL | 1 refills | Status: AC

## 2024-03-29 MED ORDER — AMLODIPINE BESYLATE 5 MG PO TABS: 5.0000 mg | ORAL_TABLET | Freq: Every day | ORAL | 2 refills | Status: AC

## 2024-03-29 MED ORDER — KETOROLAC TROMETHAMINE 60 MG/2ML IM SOLN
60.0000 mg | Freq: Once | INTRAMUSCULAR | Status: AC
  Administered 2024-03-29: 60 mg via INTRAMUSCULAR

## 2024-03-29 MED ORDER — CYCLOBENZAPRINE HCL 10 MG PO TABS: 10.0000 mg | ORAL_TABLET | Freq: Three times a day (TID) | ORAL | 0 refills | Status: AC | PRN

## 2024-03-29 MED ORDER — KETOROLAC TROMETHAMINE 60 MG/2ML IM SOLN
INTRAMUSCULAR | Status: AC
  Filled 2024-03-29: qty 2

## 2024-03-29 NOTE — ED Provider Notes (Signed)
 " [facility] [place]  Note:  This document was prepared using Dragon voice recognition software and may include unintentional dictation errors.  MRN: [id number] DOB: Jul 27, 1975  Subjective:   [name] [name] is a 48 y.o. female presenting for persistent headache x 2 to 3 days radiating from the back of her head to the top.  Patient reports past history of migraine syndrome but states that these headaches seem different from her migraines.  Patient has been taking Excedrin with no improvement.  Patient states that symptoms started on Saturday or Sunday and was concerned it may be because of her high blood pressure.  Patient reports that she has been off of her amlodipine  for several months and needs refill of medication.  Patient denies any head trauma or injury.  No vision changes or dizziness.  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  Current Medications[1]   Allergies[2]  Past Medical History:  Diagnosis Date   Anemia    Arthritis      Past Surgical History:  Procedure Laterality Date   CESAREAN SECTION      Family History  Problem Relation Age of Onset   High blood pressure Mother    High blood pressure Father     Social History[3]  ROS Refer to HPI for ROS details.  Objective:    Vitals: BP (!) 164/87   Pulse 64   Temp 98.9 F (37.2 C)   Resp 18   LMP 02/15/2024   SpO2 96%   Physical Exam Vitals and nursing note reviewed.  Constitutional:      General: She is not in acute distress.    Appearance: Normal appearance. She is well-developed. She is not ill-appearing or toxic-appearing.  HENT:     Head: Normocephalic and atraumatic.  Cardiovascular:     Rate and Rhythm: Normal rate.  Pulmonary:     Effort: Pulmonary effort is normal. No respiratory distress.     Breath sounds: No stridor. No wheezing.  Skin:    General: Skin is warm and dry.  Neurological:     General: No focal deficit  present.     Mental Status: She is alert and oriented to person, place, and time.  Psychiatric:        Mood and Affect: Mood normal.        Behavior: Behavior normal.     Procedures  No results found for this or any previous visit (from the past 24 hours).  Assessment and Plan :     Discharge Instructions       1. Tension headache (Primary) - ketorolac  (TORADOL ) injection 60 mg given in UC for acute headache and posterior neck and shoulder pain. - diclofenac  (VOLTAREN ) 50 MG EC tablet; Take 1 tablet (50 mg total) by mouth 2 (two) times daily.  Dispense: 30 tablet; Refill: 1 - cyclobenzaprine  (FLEXERIL ) 10 MG tablet; Take 1 tablet (10 mg total) by mouth 3 (three) times daily as needed for muscle spasms.  Dispense: 20 tablet; Refill: 0  2. Essential hypertension - amLODipine  (NORVASC ) 5 MG tablet; Take 1 tablet (5 mg total) by mouth daily.  Dispense: 30 tablet; Refill: 2  -Continue to monitor symptoms for any change in severity if there is any escalation of current symptoms or development of new symptoms follow-up in ER for further evaluation and management.      Sharita Bienaime B Jachelle Fluty    [1] No current facility-administered medications for this encounter.  Current Outpatient Medications:    amLODipine  (NORVASC ) 5 MG tablet, Take 1 tablet (5 mg total) by mouth daily., Disp: 30 tablet, Rfl: 2   cyclobenzaprine  (FLEXERIL ) 10 MG tablet, Take 1 tablet (10 mg total) by mouth 3 (three) times daily as needed for muscle spasms., Disp: 20 tablet, Rfl: 0   diclofenac  (VOLTAREN ) 50 MG EC tablet, Take 1 tablet (50 mg total) by mouth 2 (two) times daily., Disp: 30 tablet, Rfl: 1   albuterol  (VENTOLIN  HFA) 108 (90 Base) MCG/ACT inhaler, Inhale 1-2 puffs into the lungs every 6 (six) hours as needed for shortness of breath., Disp: , Rfl:    aspirin-acetaminophen -caffeine (EXCEDRIN MIGRAINE) 250-250-65 MG tablet, Take 1 tablet by mouth every 6 (six) hours as needed for headache., Disp: , Rfl:     ibuprofen  (ADVIL ) 400 MG tablet, Take 1 tablet (400 mg total) by mouth every 8 (eight) hours as needed for up to 30 doses., Disp: 30 tablet, Rfl: 0   Ipratropium-Albuterol  (COMBIVENT  RESPIMAT) 20-100 MCG/ACT AERS respimat, Inhale 1 puff into the lungs as needed., Disp: , Rfl:  [2] No Known Allergies [3]  Social History Tobacco Use   Smoking status: Former    Current packs/day: 0.20    Types: Cigars, Cigarettes   Smokeless tobacco: Never   Tobacco comments:    3 cigars a day  Vaping Use   Vaping status: Never Used  Substance Use Topics   Alcohol use: No   Drug use: No     Aurea Goodell B, NP 03/29/24 1927  "

## 2024-03-29 NOTE — ED Triage Notes (Signed)
 Pt presents with headache in the back of her head. She has hx of migraine but this feels different. Pain started over the weekend. Pt is concerned for high bp.

## 2024-03-29 NOTE — Discharge Instructions (Signed)
" °  1. Tension headache (Primary) - ketorolac  (TORADOL ) injection 60 mg given in UC for acute headache and posterior neck and shoulder pain. - diclofenac  (VOLTAREN ) 50 MG EC tablet; Take 1 tablet (50 mg total) by mouth 2 (two) times daily.  Dispense: 30 tablet; Refill: 1 - cyclobenzaprine  (FLEXERIL ) 10 MG tablet; Take 1 tablet (10 mg total) by mouth 3 (three) times daily as needed for muscle spasms.  Dispense: 20 tablet; Refill: 0  2. Essential hypertension - amLODipine  (NORVASC ) 5 MG tablet; Take 1 tablet (5 mg total) by mouth daily.  Dispense: 30 tablet; Refill: 2  -Continue to monitor symptoms for any change in severity if there is any escalation of current symptoms or development of new symptoms follow-up in ER for further evaluation and management. "
